# Patient Record
Sex: Male | Born: 1955 | Race: White | Hispanic: No | Marital: Married
Health system: Midwestern US, Community
[De-identification: ages and names within clinical notes are randomized; demographics above are authoritative.]

## PROBLEM LIST (undated history)

## (undated) DIAGNOSIS — M25572 Pain in left ankle and joints of left foot: Secondary | ICD-10-CM

## (undated) DIAGNOSIS — S96912A Strain of unspecified muscle and tendon at ankle and foot level, left foot, initial encounter: Secondary | ICD-10-CM

## (undated) DIAGNOSIS — Z01818 Encounter for other preprocedural examination: Secondary | ICD-10-CM

## (undated) DIAGNOSIS — M7672 Peroneal tendinitis, left leg: Secondary | ICD-10-CM

## (undated) DIAGNOSIS — X503XXA Overexertion from repetitive movements, initial encounter: Secondary | ICD-10-CM

---

## 2015-04-13 ENCOUNTER — Encounter

## 2015-04-22 ENCOUNTER — Inpatient Hospital Stay
Admit: 2015-04-22 | Payer: TRICARE (CHAMPUS) | Attending: Foot & Ankle Surgery | Primary: Student in an Organized Health Care Education/Training Program

## 2015-04-22 DIAGNOSIS — M25572 Pain in left ankle and joints of left foot: Secondary | ICD-10-CM

## 2015-05-13 ENCOUNTER — Encounter

## 2015-05-13 ENCOUNTER — Inpatient Hospital Stay: Admit: 2015-05-13 | Payer: TRICARE (CHAMPUS) | Primary: Student in an Organized Health Care Education/Training Program

## 2015-05-13 DIAGNOSIS — Z01818 Encounter for other preprocedural examination: Secondary | ICD-10-CM

## 2015-05-13 LAB — CBC WITH AUTOMATED DIFF
ABS. BASOPHILS: 0.1 10*3/uL — ABNORMAL HIGH (ref 0.0–0.06)
ABS. EOSINOPHILS: 0.2 10*3/uL (ref 0.0–0.4)
ABS. LYMPHOCYTES: 2.1 10*3/uL (ref 0.9–3.6)
ABS. MONOCYTES: 0.7 10*3/uL (ref 0.05–1.2)
ABS. NEUTROPHILS: 4.9 10*3/uL (ref 1.8–8.0)
BASOPHILS: 1 % (ref 0–2)
EOSINOPHILS: 2 % (ref 0–5)
HCT: 37 % (ref 36.0–48.0)
HGB: 12.9 g/dL — ABNORMAL LOW (ref 13.0–16.0)
LYMPHOCYTES: 27 % (ref 21–52)
MCH: 32.8 PG (ref 24.0–34.0)
MCHC: 34.9 g/dL (ref 31.0–37.0)
MCV: 94.1 FL (ref 74.0–97.0)
MONOCYTES: 9 % (ref 3–10)
MPV: 8.6 FL — ABNORMAL LOW (ref 9.2–11.8)
NEUTROPHILS: 61 % (ref 40–73)
PLATELET: 335 10*3/uL (ref 135–420)
RBC: 3.93 M/uL — ABNORMAL LOW (ref 4.70–5.50)
RDW: 12.3 % (ref 11.6–14.5)
WBC: 8 10*3/uL (ref 4.6–13.2)

## 2015-05-13 LAB — METABOLIC PANEL, BASIC
Anion gap: 8 mmol/L (ref 3.0–18)
BUN/Creatinine ratio: 10 — ABNORMAL LOW (ref 12–20)
BUN: 12 MG/DL (ref 7.0–18)
CO2: 31 mmol/L (ref 21–32)
Calcium: 9.1 MG/DL (ref 8.5–10.1)
Chloride: 100 mmol/L (ref 100–108)
Creatinine: 1.19 MG/DL (ref 0.6–1.3)
GFR est AA: >60 mL/min/{1.73_m2} (ref >60)
GFR est non-AA: >60 mL/min/{1.73_m2} (ref >60)
Glucose: 94 mg/dL (ref 74–99)
Potassium: 4.3 mmol/L (ref 3.5–5.5)
Sodium: 139 mmol/L (ref 136–145)

## 2015-05-13 LAB — PROTHROMBIN TIME + INR
INR: 1 (ref 0.8–1.2)
Prothrombin time: 12.6 s (ref 11.5–15.2)

## 2015-05-13 LAB — PTT: aPTT: 30.7 s (ref 23.0–36.4)

## 2015-05-14 LAB — URINALYSIS W/ RFLX MICROSCOPIC
Bilirubin: NEGATIVE
Blood: NEGATIVE
Glucose: NEGATIVE mg/dL
Ketone: NEGATIVE mg/dL
Leukocyte Esterase: NEGATIVE
Nitrites: NEGATIVE
Protein: NEGATIVE mg/dL
Specific gravity: 1.006 (ref 1.005–1.030)
Urobilinogen: 0.2 EU/dL (ref 0.2–1.0)
pH (UA): 6.5 (ref 5.0–8.0)

## 2015-05-14 LAB — EKG, 12 LEAD, INITIAL
Atrial Rate: 61 {beats}/min
Calculated P Axis: 46 degrees
Calculated R Axis: -16 degrees
Calculated T Axis: 23 degrees
Diagnosis: NORMAL
P-R Interval: 184 ms
Q-T Interval: 404 ms
QRS Duration: 98 ms
QTC Calculation (Bezet): 406 ms
Ventricular Rate: 61 {beats}/min

## 2015-05-14 LAB — MIH FAX RESULT: FAX TO NUMBER: 9685445

## 2015-05-14 NOTE — Other (Signed)
Denies any prostheticsPatient states family physician is aware of upcoming procedure/surgeryDenies sleep apneaDenies family history of anesthesia complicationsDenies shortness of breath nor chest pain while climbing stairs  Does not meet spec pop

## 2015-05-15 ENCOUNTER — Inpatient Hospital Stay: Payer: TRICARE (CHAMPUS) | Primary: Student in an Organized Health Care Education/Training Program

## 2015-05-21 ENCOUNTER — Ambulatory Visit: Admit: 2015-05-21 | Payer: TRICARE (CHAMPUS) | Primary: Student in an Organized Health Care Education/Training Program

## 2015-05-21 ENCOUNTER — Inpatient Hospital Stay: Payer: TRICARE (CHAMPUS)

## 2015-05-21 MED ORDER — PROPOFOL 10 MG/ML IV EMUL
10 mg/mL | INTRAVENOUS | Status: DC | PRN
Start: 2015-05-21 — End: 2015-05-21
  Administered 2015-05-21: 12:00:00 via INTRAVENOUS

## 2015-05-21 MED ORDER — SODIUM CHLORIDE 0.9 % INJECTION
INTRAMUSCULAR | Status: AC
Start: 2015-05-21 — End: ?

## 2015-05-21 MED ORDER — GLYCOPYRROLATE 0.2 MG/ML IJ SOLN
0.2 mg/mL | INTRAMUSCULAR | Status: DC | PRN
Start: 2015-05-21 — End: 2015-05-21
  Administered 2015-05-21: 12:00:00 via INTRAVENOUS

## 2015-05-21 MED ORDER — HYDROMORPHONE 2 MG/ML INJECTION SOLUTION
2 mg/mL | INTRAMUSCULAR | Status: DC | PRN
Start: 2015-05-21 — End: 2015-05-21

## 2015-05-21 MED ORDER — SUCCINYLCHOLINE CHLORIDE 20 MG/ML INJECTION
20 mg/mL | INTRAMUSCULAR | Status: AC
Start: 2015-05-21 — End: ?

## 2015-05-21 MED ORDER — BUPIVACAINE-EPINEPHRINE (PF) 0.5 %-1:200,000 IJ SOLN
0.5 %-1:200,000 | INTRAMUSCULAR | Status: AC
Start: 2015-05-21 — End: ?

## 2015-05-21 MED ORDER — ROCURONIUM 10 MG/ML IV
10 mg/mL | INTRAVENOUS | Status: AC
Start: 2015-05-21 — End: ?

## 2015-05-21 MED ORDER — ONDANSETRON (PF) 4 MG/2 ML INJECTION
4 mg/2 mL | INTRAMUSCULAR | Status: AC
Start: 2015-05-21 — End: ?

## 2015-05-21 MED ORDER — BACITRACIN 50,000 UNIT IM
50000 unit | INTRAMUSCULAR | Status: AC
Start: 2015-05-21 — End: ?

## 2015-05-21 MED ORDER — LACTATED RINGERS IV
INTRAVENOUS | Status: DC
Start: 2015-05-21 — End: 2015-05-21
  Administered 2015-05-21 (×3): via INTRAVENOUS

## 2015-05-21 MED ORDER — ONDANSETRON (PF) 4 MG/2 ML INJECTION
4 mg/2 mL | INTRAMUSCULAR | Status: DC | PRN
Start: 2015-05-21 — End: 2015-05-21
  Administered 2015-05-21: 12:00:00 via INTRAVENOUS

## 2015-05-21 MED ORDER — BUPIVACAINE-EPINEPHRINE (PF) 0.5 %-1:200,000 IJ SOLN
0.5 %-1:200,000 | INTRAMUSCULAR | Status: DC | PRN
Start: 2015-05-21 — End: 2015-05-21
  Administered 2015-05-21: 12:00:00 via INTRAMUSCULAR

## 2015-05-21 MED ORDER — FAMOTIDINE 20 MG IN 50 ML NS IVPB
20 mg/50 mL | Freq: Once | INTRAVENOUS | Status: DC
Start: 2015-05-21 — End: 2015-05-21

## 2015-05-21 MED ORDER — FENTANYL CITRATE (PF) 50 MCG/ML IJ SOLN
50 mcg/mL | INTRAMUSCULAR | Status: DC | PRN
Start: 2015-05-21 — End: 2015-05-21
  Administered 2015-05-21 (×6): via INTRAVENOUS

## 2015-05-21 MED ORDER — MIDAZOLAM 1 MG/ML IJ SOLN
1 mg/mL | INTRAMUSCULAR | Status: DC | PRN
Start: 2015-05-21 — End: 2015-05-21
  Administered 2015-05-21: 12:00:00 via INTRAVENOUS

## 2015-05-21 MED ORDER — LIDOCAINE (PF) 20 MG/ML (2 %) IJ SOLN
20 mg/mL (2 %) | INTRAMUSCULAR | Status: AC
Start: 2015-05-21 — End: ?

## 2015-05-21 MED ORDER — EPHEDRINE (PF) 50 MG/10 ML (5 MG/ML) IN NS IV SYRINGE
50 mg/10 mL (5 mg/mL) | INTRAVENOUS | Status: AC
Start: 2015-05-21 — End: ?

## 2015-05-21 MED ORDER — MIDAZOLAM 1 MG/ML IJ SOLN
1 mg/mL | INTRAMUSCULAR | Status: AC
Start: 2015-05-21 — End: ?

## 2015-05-21 MED ORDER — GLYCOPYRROLATE 0.2 MG/ML IJ SOLN
0.2 mg/mL | INTRAMUSCULAR | Status: AC
Start: 2015-05-21 — End: ?

## 2015-05-21 MED ORDER — SODIUM CHLORIDE 0.9 % INJECTION
20 mg/2 mL | Freq: Once | INTRAMUSCULAR | Status: AC
Start: 2015-05-21 — End: 2015-05-21
  Administered 2015-05-21: 11:00:00 via INTRAVENOUS

## 2015-05-21 MED ORDER — PROPOFOL 10 MG/ML IV EMUL
10 mg/mL | INTRAVENOUS | Status: AC
Start: 2015-05-21 — End: ?

## 2015-05-21 MED ORDER — ACETAMINOPHEN 1,000 MG/100 ML (10 MG/ML) IV
1000 mg/100 mL (10 mg/mL) | Freq: Once | INTRAVENOUS | Status: DC | PRN
Start: 2015-05-21 — End: 2015-05-21

## 2015-05-21 MED ORDER — EPHEDRINE (PF) 50 MG/10 ML (5 MG/ML) IN NS IV SYRINGE
50 mg/10 mL (5 mg/mL) | INTRAVENOUS | Status: DC | PRN
Start: 2015-05-21 — End: 2015-05-21
  Administered 2015-05-21 (×4): via INTRAVENOUS

## 2015-05-21 MED ORDER — CEFAZOLIN 2 G IN 100 ML 0.9% NS
2 gram/100 mL | Freq: Once | INTRAVENOUS | Status: AC
Start: 2015-05-21 — End: 2015-05-21
  Administered 2015-05-21: 12:00:00 via INTRAVENOUS

## 2015-05-21 MED ORDER — SODIUM CHLORIDE 0.9 % IRRIGATION SOLN
0.9 % | Status: DC | PRN
Start: 2015-05-21 — End: 2015-05-21
  Administered 2015-05-21: 12:00:00

## 2015-05-21 MED ORDER — FENTANYL CITRATE (PF) 50 MCG/ML IJ SOLN
50 mcg/mL | INTRAMUSCULAR | Status: AC
Start: 2015-05-21 — End: ?

## 2015-05-21 MED ORDER — KETOROLAC TROMETHAMINE 30 MG/ML INJECTION
30 mg/mL (1 mL) | Freq: Once | INTRAMUSCULAR | Status: DC | PRN
Start: 2015-05-21 — End: 2015-05-21

## 2015-05-21 MED ORDER — LIDOCAINE (PF) 20 MG/ML (2 %) IJ SOLN
20 mg/mL (2 %) | INTRAMUSCULAR | Status: DC | PRN
Start: 2015-05-21 — End: 2015-05-21
  Administered 2015-05-21: 12:00:00 via INTRAVENOUS

## 2015-05-21 MED ORDER — ROCURONIUM 10 MG/ML IV
10 mg/mL | INTRAVENOUS | Status: DC | PRN
Start: 2015-05-21 — End: 2015-05-21
  Administered 2015-05-21: 12:00:00 via INTRAVENOUS

## 2015-05-21 MED FILL — QUELICIN 20 MG/ML INJECTION SOLUTION: 20 mg/mL | INTRAMUSCULAR | Qty: 10

## 2015-05-21 MED FILL — PROPOFOL 10 MG/ML IV EMUL: 10 mg/mL | INTRAVENOUS | Qty: 20

## 2015-05-21 MED FILL — SODIUM CHLORIDE 0.9 % INJECTION: INTRAMUSCULAR | Qty: 10

## 2015-05-21 MED FILL — LACTATED RINGERS IV: INTRAVENOUS | Qty: 1000

## 2015-05-21 MED FILL — FAMOTIDINE (PF) 20 MG/2 ML IV: 20 mg/2 mL | INTRAVENOUS | Qty: 2

## 2015-05-21 MED FILL — BACITRACIN 50,000 UNIT IM: 50000 unit | INTRAMUSCULAR | Qty: 50000

## 2015-05-21 MED FILL — CEFAZOLIN 2 G IN 100 ML 0.9% NS: 2 gram/100 mL | INTRAVENOUS | Qty: 100

## 2015-05-21 MED FILL — ROCURONIUM 10 MG/ML IV: 10 mg/mL | INTRAVENOUS | Qty: 5

## 2015-05-21 MED FILL — EPHEDRINE (PF) 50 MG/10 ML (5 MG/ML) IN NS IV SYRINGE: 50 mg/10 mL (5 mg/mL) | INTRAVENOUS | Qty: 10

## 2015-05-21 MED FILL — MARCAINE-EPINEPHRINE (PF) 0.5 %-1:200,000 INJECTION SOLUTION: 0.5 %-1:200,000 | INTRAMUSCULAR | Qty: 30

## 2015-05-21 MED FILL — ONDANSETRON (PF) 4 MG/2 ML INJECTION: 4 mg/2 mL | INTRAMUSCULAR | Qty: 2

## 2015-05-21 MED FILL — MIDAZOLAM 1 MG/ML IJ SOLN: 1 mg/mL | INTRAMUSCULAR | Qty: 2

## 2015-05-21 MED FILL — LIDOCAINE (PF) 20 MG/ML (2 %) IJ SOLN: 20 mg/mL (2 %) | INTRAMUSCULAR | Qty: 10

## 2015-05-21 MED FILL — GLYCOPYRROLATE 0.2 MG/ML IJ SOLN: 0.2 mg/mL | INTRAMUSCULAR | Qty: 2

## 2015-05-21 MED FILL — FENTANYL CITRATE (PF) 50 MCG/ML IJ SOLN: 50 mcg/mL | INTRAMUSCULAR | Qty: 5

## 2015-05-21 NOTE — Op Note (Signed)
[facility]  OPERATIVE REPORT    Name:  Lawrence Lawrence  MR#:  [id number]  DOB:  [birth year]  Account #:  [id number]  Date of Adm:  05/21/2015  Date of Surgery:  05/21/2015      SPECIMENS REMOVED: none    SURGEON: Doretha ImusAaron Jahree Dermody, DPM    ASSISTANT: Eliberto IvoryAustin.    PREOPERATIVE DIAGNOSES:  1. Painful spontaneous rupture of the left peroneus brevis tendon.  2. Painful left peroneal tendinitis.    POSTOPERATIVE DIAGNOSES:  1. Painful spontaneous rupture of the left peroneus brevis tendon.  2. Painful left peroneal tendinitis.    PROCEDURES:  1. Repair of dislocating peroneal tendons with fibular osteotomy, left  ankle.  2. Repair of left ruptured peroneus brevis tendon with graft.    ANESTHESIA: General with 10 mL of 0.5% Marcaine with epinephrine  to the left ankle.    HEMOSTASIS: None. There was no tourniquet used in this procedure.    ESTIMATED BLOOD LOSS: Minimal, less than 1 mL.    MATERIALS: [employer] GraftJacket MaxForce 4 x 7 graft and  also a [facility] GraftJacket low profile 2 x 4 cm graft, 3-0 Vicryl,  5-0 nylon, 3-0 and 4-0 Prolene were also used.    INJECTABLES: 16 mL of 0.5% Marcaine with epinephrine.    COMPLICATIONS: None.    INDICATIONS FOR PROCEDURE: The patient is a 60 year old male  [name]  correction of painful left ankle. All conservative treatments have failed  to offer the patient adequate relief and the patient desires surgical  correction. The planned procedures were explained to the patient in  detail including all risks, benefits, and possible complications, and the  patient still desires surgical correction. Medical clearance was obtained  prior to surgery. Consent was signed and on chart. All the patient's  questions were answered and no guarantees were given. Also, the  patient was asked if he had any history of blood clots or bleeding  disorders, and he stated that he had none.    DESCRIPTION OF  PROCEDURE: The patient was brought to the  operating room and placed on the operating room table in the prone  position. The patient's left ankle was then anesthetized using 10 mL of  0.5% Marcaine with epinephrine as an ankle block. Next, the patient's  left ankle and foot were then prepped and draped in the usual sterile  manner. Again, no tourniquet was used for this procedure.    Attention was then drawn to the lateral aspect of the patient's left ankle  where a #15 blade was then used to make an approximately 10 cm  curvilinear incision starting at the posterior aspect of the distal fibula  and lateral malleolus and then coursing down along the peroneal  tendons. A second #15 blade was then used to deepen this incision  through the deeper layers, being careful to avoid any neurovascular  structures. Once dissection was then carried down through the deeper  tissues to expose the lateral malleolus along with the peroneal tendon  sheath, the peroneal tendon sheath was then gently incised and the  peroneal tendons were then identified. The peroneus longus tendon  was superficial, which was then retracted out of the way and inspected.  The peroneus longus tendon was found to be free of defects.  Next, the peroneus brevis tendon, which was then deeper to the  longus tendon was then inspected. The peroneus brevis tendon was  then  found to have a large, approximately 3 cm longitudinal tear at the  posterior articulation with the fibula. This had caused the tendon to  actually split and open up and then form a flat deformity in the tendon.  This torn tendon was apparently causing the patient pain as it  would pop back and forth at the fibular articulation. Therefore, the  peroneus brevis tendon tear was repaired using 3-0 Vicryl in a running  stitch. After this was done, the tendon was then supported and  repaired using the GraftJacket low profile graft and sutured into place  using 4-0 Prolene. The graft was then  tubularized and reinforced so  that it would not move along the tendon as the tendon was put through  it's use, and articulated with the posterior aspect of the fibula. Next, the  distal posterior fibula was inspected and found to be relatively flat with  no real fibular groove for the peroneal tendons to sit in, and therefore  causing the peroneal tendons to sublux and pop out of place when the  patient would walk. Therefore, the bone bur was then used to create a  deep peroneal groove in the fibula for the tendons to articulate.  This  would hold them in place, doing ankle dorsiflexion, plantar flexion  range of motion. After the fibular osteotomy was completed, the area  was flushed with copious amounts of normal sterile saline. Next, the  peroneal tendons were put back into their original positions and the  tendon sheaths were then repaired using 3-0 Vicryl in a running  stitch. Next, a MaxForce GraftJacket was then used to reinforce the  soft tissue retinaculum that holds the tendons in place and prevents  them from subluxating over the lateral fibular groove. This graft was  sutured into place using 3-0 Prolene. This graft was the MaxForce 5 x  7 cm graft. This graft was sutured into place tightly to again reinforce  and hold the peroneal tendons down in place. At this point, the  patient's left ankle was then put through normal range of motion, both  dorsiflexion, plantar flexion, and eversion/inversion and found to be  adequate with no problems. The peroneal tendons were found to glide  smoothly into the new fibular groove and found to be free of  subluxation or dislocation. Therefore, the deep tissues were closed  using 3-0 Vicryl in a simple interrupted technique. The surgical site was  then flushed again with copious amounts of normal sterile saline. The  skin was then closed using 5-0 nylon in a subcuticular running stitch.  The incision was then supported using Steri-Strips, Adaptic, 4 x 4s,  Kerlix,  ABDs and an Ace wrap. The patient was then transported to the  recovery room with vital signs stable and neurovascular status  returned to baseline for the patient's left foot. The patient tolerated the  procedures and anesthesia well with no complications.    POSTOPERATIVE CHECK: The patient was later seen in the recovery  room with his dressings clean, dry, and intact with no bleeding noted to  his left foot or ankle. The patient stated that he had no pain. The  patient's vascular status was then checked and capillary refill for all 5  toes on his left foot and found to be less than 2 seconds for each toe.  The patient was later discharged to home. He was given a  postoperative prescription for pain, also a surgical shoe. He was given  also discharge  instructions. He was also given a followup appointment  with Dr. Emily Filbert in the office in 3 days. Lastly, he and his wife were  given Dr. Dellia Nims cell phone number and instructed to call anytime with  any problems.        Clemetine Marker, DPM    AG / MS1  D:  05/21/2015   14:40  T:  05/21/2015   18:32  Job #:  [id number]

## 2015-05-21 NOTE — Op Note (Signed)
Will dictate full up note into the system

## 2015-05-21 NOTE — Anesthesia Post-Procedure Evaluation (Signed)
Post-Anesthesia Evaluation and Assessment    Cardiovascular Function/Vital Signs  Visit Vitals   ??? BP 148/67   ??? Pulse 74   ??? Temp 36.3 ??C (97.4 ??F)   ??? Resp 12   ??? Ht 6' (1.829 m)   ??? Wt 101.8 kg (224 lb 8 oz)   ??? SpO2 97%   ??? BMI 30.45 kg/m2       Patient is status post Procedure(s):  LEFT PERONEAL TENDON REPAIR W/GRAFT,REPAIR OF DISLOCATION PERONEAL TENDON W/FIBULAR OSTEOTOMY,.    Nausea/Vomiting: Controlled.    Postoperative hydration reviewed and adequate.    Pain:  Pain Scale 1: Numeric (0 - 10) (05/21/15 1200)  Pain Intensity 1: 0 (05/21/15 1200)   Managed.    Neurological Status:   Neuro (WDL): Exceptions to WDL (05/21/15 1144)   At baseline.    Mental Status and Level of Consciousness: Arousable.    Pulmonary Status:   O2 Device: Nasal cannula (05/21/15 1200)   Adequate oxygenation and airway patent.    Complications related to anesthesia: None    Post-anesthesia assessment completed. No concerns.    Patient has met all discharge requirements.    Signed By: Galvin Proffer, CRNA    May 21, 2015

## 2015-05-21 NOTE — H&P (Signed)
Date of Surgery Update:  Mickie BailMichael C Culliver was seen and examined.  History and physical has been reviewed. The patient has been examined. There have been no significant clinical changes since the completion of the originally dated History and Physical.    Signed By: Clemetine MarkerAaron G Bhumi Godbey, DPM     May 21, 2015 7:31 AM

## 2015-05-21 NOTE — Other (Signed)
Dr Isabella StallingBaines notified of patient drinking 8 oz water at 0430 this morning. Pepcid 20 mg IV ordered. Will adjust anesthesia plan due to water intake per Dr Isabella StallingBaines.

## 2015-05-21 NOTE — Other (Signed)
Dr Emily FilbertGould at the bed side, patient to be discharge home after Xray done, no need to wait for results, patient is NWB, to receive post-op surgical shoes, to be instructed to keep leg elevated and apply ice 20 min on 20 min off on the ankle . Keep the leg dry .

## 2015-05-21 NOTE — Other (Signed)
Wife updated with progress.

## 2015-05-21 NOTE — Brief Op Note (Signed)
BRIEF OPERATIVE NOTE    Date of Procedure: 05/21/2015   Preoperative Diagnosis: LEFT ANKLE TENDON TEAR  Postoperative Diagnosis: LEFT ANKLE TENDON TEAR    Procedure(s):  LEFT PERONEAL TENDON REPAIR W/GRAFT,REPAIR OF DISLOCATION PERONEAL TENDON W/FIBULAR OSTEOTOMY,  Surgeon(s) and Role:     * Clemetine MarkerAaron G Dyson Sevey, DPM - Primary            Surgical Staff:  Circ-1: Everlena Cooperawn F Sanders, RN  Circ-2: Alfonso RamusSamantha Pope, RN  Scrub Tech-1: Johnny BridgeAustin M Oliver  Scrub Tech-Relief: Kennon Holterhristine M Lamb  Scrub RN-1: Fransico HimMargo E Cheely, RN  Event Time In   Incision Start 0809   Incision Close 1120     Anesthesia: General   Estimated Blood Loss: min<1031ml  Specimens: * No specimens in log * none   Findings: torn left peroneus brevis tendon   Complications: none  Implants:   Implant Name Type Inv. Item Serial No. Manufacturer Lot No. LRB No. Used Action   GRAFT TISS SUB SFT MTRX 4X7CM -- 28 SQ CM MAXFORCE - [id number]  GRAFT TISS SUB SFT MTRX 4X7CM -- 28 SQ CM MAXFORCE  [employer] [id number] Left 1 Implanted   GRAFT TISS LOW PROF THIN 2X4CM -- 8 SQ CM HANDJACKET - [id number]   GRAFT TISS LOW PROF THIN 2X4CM -- 8 SQ CM HANDJACKET   [employer] [employer] [id number] Left 1 Implanted

## 2015-05-21 NOTE — Other (Signed)
TRANSFER - IN REPORT:    Verbal report received from [facility] CRNA (name) on Lawrence Cabrera  being received from OR (unit) for routine progression of care      Report consisted of patient???s Situation, Background, Assessment and   Recommendations(SBAR).     Information from the following report(s) OR Summary, Procedure Summary and Intake/Output was reviewed with the receiving nurse.    Opportunity for questions and clarification was provided.      Assessment completed upon patient???s arrival to unit and care assumed.

## 2015-05-21 NOTE — Anesthesia Pre-Procedure Evaluation (Addendum)
Anesthetic History   No history of anesthetic complications     Pertinent negatives: No PONV       Review of Systems / Medical History  Patient summary reviewed, nursing notes reviewed and pertinent labs reviewed    Pulmonary              Pertinent negatives: No COPD and asthma     Neuro/Psych   Within defined limits           Cardiovascular                Pertinent negatives: No hypertension, past MI and CAD       GI/Hepatic/Renal  Within defined limits           Pertinent negatives: No GERD, liver disease and renal disease   Endo/Other  Within defined limits        Pertinent negatives: No diabetes   Other Findings            Physical Exam    Airway  Mallampati: II  TM Distance: < 4 cm  Neck ROM: normal range of motion   Mouth opening: Normal     Cardiovascular    Rhythm: regular  Rate: normal         Dental         Pulmonary  Breath sounds clear to auscultation               Abdominal         Other Findings            Anesthetic Plan    ASA: 1  Anesthesia type: general          Induction: Intravenous and RSI  Anesthetic plan and risks discussed with: Patient      GETA mod RSI

## 2015-05-21 NOTE — Op Note (Signed)
[facility]  OPERATIVE REPORT    Name:  NHIA, HEAPHY  MR#:  [id number]  DOB:  1956-02-02  Account #:  [id number]  Date of Adm:  05/21/2015  Date of Surgery:  05/21/2015      SPECIMENS REMOVED: none    SURGEON: Doretha Imus, DPM    ASSISTANT: Eliberto Ivory.    PREOPERATIVE DIAGNOSES:  1. Painful spontaneous rupture of the left peroneus brevis tendon.  2. Painful left peroneal tendinitis.    POSTOPERATIVE DIAGNOSES:  1. Painful spontaneous rupture of the left peroneus brevis tendon.  2. Painful left peroneal tendinitis.    PROCEDURES:  1. Repair of dislocating peroneal tendons with fibular osteotomy, left  ankle.  2. Repair of left ruptured peroneus brevis tendon with graft.    ANESTHESIA: General with 10 mL of 0.5% Marcaine with epinephrine  to the left ankle.    HEMOSTASIS: None. There was no tourniquet used in this procedure.    ESTIMATED BLOOD LOSS: Minimal, less than 1 mL.    MATERIALS: [employer] GraftJacket MaxForce 4 x 7 graft and  also a [facility] low profile 2 x 4 cm graft, 3-0 Vicryl,  5-0 nylon, 3-0 and 4-0 Prolene were also used.    INJECTABLES: 16 mL of 0.5% Marcaine with epinephrine.    COMPLICATIONS: None.    INDICATIONS FOR PROCEDURE: The patient is a 60 year old male  who presents to [facility] for outpatient surgical  correction of painful left ankle. All conservative treatments have failed  to offer the patient adequate relief and the patient desires surgical  correction. The planned procedures were explained to the patient in  detail including all risks, benefits, and possible complications, and the  patient still desires surgical correction. Medical clearance was obtained  prior to surgery. Consent was signed and on chart. All the patient's  questions were answered and no guarantees were given. Also, the  patient was asked if he had any history of blood clots or bleeding  disorders, and he stated that he had none.     DESCRIPTION OF PROCEDURE: The patient was brought to the  operating room and placed on the operating room table in the prone  position. The patient's left ankle was then anesthetized using 10 mL of  0.5% Marcaine with epinephrine as an ankle block. Next, the patient's  left ankle and foot were then prepped and draped in the usual sterile  manner. Again, no tourniquet was used for this procedure.    Attention was then drawn to the lateral aspect of the patient's left ankle  where a #15 blade was then used to make an approximately 10 cm  curvilinear incision starting at the posterior aspect of the distal fibula  and lateral malleolus and then coursing down along the peroneal  tendons. A second #15 blade was then used to deepen this incision  through the deeper layers, being careful to avoid any neurovascular  structures. Once dissection was then carried down through the deeper  tissues to expose the lateral malleolus along with the peroneal tendon  sheath, the peroneal tendon sheath was then gently incised and the  peroneal tendons were then identified. The peroneus longus tendon  was superficial, which was then retracted out of the way and inspected.  The peroneus longus tendon was found to be free of defects.  Next, the peroneus brevis tendon, which was then deeper to the  longus tendon was then inspected. The peroneus brevis tendon was  then  found to have a large, approximately 3 cm longitudinal tear at the  posterior articulation with the fibula. This had caused the tendon to  actually split and open up and then form a flat deformity in the tendon.  This torn tendon was apparently causing the patient pain as it  would pop back and forth at the fibular articulation. Therefore, the  peroneus brevis tendon tear was repaired using 3-0 Vicryl in a running  stitch. After this was done, the tendon was then supported and  repaired using the GraftJacket low profile graft and sutured into place   using 4-0 Prolene. The graft was then tubularized and reinforced so  that it would not move along the tendon as the tendon was put through  it's use, and articulated with the posterior aspect of the fibula. Next, the  distal posterior fibula was inspected and found to be relatively flat with  no real fibular groove for the peroneal tendons to sit in, and therefore  causing the peroneal tendons to sublux and pop out of place when the  patient would walk. Therefore, the bone bur was then used to create a  deep peroneal groove in the fibula for the tendons to articulate.  This  would hold them in place, doing ankle dorsiflexion, plantar flexion  range of motion. After the fibular osteotomy was completed, the area  was flushed with copious amounts of normal sterile saline. Next, the  peroneal tendons were put back into their original positions and the  tendon sheaths were then repaired using 3-0 Vicryl in a running  stitch. Next, a MaxForce GraftJacket was then used to reinforce the  soft tissue retinaculum that holds the tendons in place and prevents  them from subluxating over the lateral fibular groove. This graft was  sutured into place using 3-0 Prolene. This graft was the MaxForce 5 x  7 cm graft. This graft was sutured into place tightly to again reinforce  and hold the peroneal tendons down in place. At this point, the  patient's left ankle was then put through normal range of motion, both  dorsiflexion, plantar flexion, and eversion/inversion and found to be  adequate with no problems. The peroneal tendons were found to glide  smoothly into the new fibular groove and found to be free of  subluxation or dislocation. Therefore, the deep tissues were closed  using 3-0 Vicryl in a simple interrupted technique. The surgical site was  then flushed again with copious amounts of normal sterile saline. The  skin was then closed using 5-0 nylon in a subcuticular running stitch.   The incision was then supported using Steri-Strips, Adaptic, 4 x 4s,  Kerlix, ABDs and an Ace wrap. The patient was then transported to the  recovery room with vital signs stable and neurovascular status  returned to baseline for the patient's left foot. The patient tolerated the  procedures and anesthesia well with no complications.    POSTOPERATIVE CHECK: The patient was later seen in the recovery  room with his dressings clean, dry, and intact with no bleeding noted to  his left foot or ankle. The patient stated that he had no pain. The  patient's vascular status was then checked and capillary refill for all 5  toes on his left foot and found to be less than 2 seconds for each toe.  The patient was later discharged to home. He was given a  postoperative prescription for pain, also a surgical shoe. He was given  also discharge  instructions. He was also given a followup appointment  with Dr. Emily Filbert in the office in 3 days. Lastly, he and his wife were  given Dr. Dellia Nims cell phone number and instructed to call anytime with  any problems.        Clemetine Marker, DPM    AG / MS1  D:  05/21/2015   14:40  T:  05/21/2015   18:32  Job #:  [id number]

## 2015-05-22 MED FILL — EPHEDRINE (PF) 50 MG/10 ML (5 MG/ML) IN NS IV SYRINGE: 50 mg/10 mL (5 mg/mL) | INTRAVENOUS | Qty: 7

## 2015-05-22 MED FILL — GLYCOPYRROLATE 0.2 MG/ML IJ SOLN: 0.2 mg/mL | INTRAMUSCULAR | Qty: 1

## 2015-05-22 MED FILL — PROPOFOL 10 MG/ML IV EMUL: 10 mg/mL | INTRAVENOUS | Qty: 20

## 2015-05-22 MED FILL — ONDANSETRON (PF) 4 MG/2 ML INJECTION: 4 mg/2 mL | INTRAMUSCULAR | Qty: 2

## 2015-05-22 MED FILL — XYLOCAINE-MPF 20 MG/ML (2 %) INJECTION SOLUTION: 20 mg/mL (2 %) | INTRAMUSCULAR | Qty: 3

## 2015-05-22 MED FILL — ROCURONIUM 10 MG/ML IV: 10 mg/mL | INTRAVENOUS | Qty: 5

## 2019-05-29 IMAGING — US DOP ARTERIAL LWR EXT BIL
1 series · 13 of 16 positions shown · non-contrast
Comparison: None.

HISTORY: 64 years-old Male with Peripheral vascular disease, unspecified.
TECHNIQUE: Using real-time and Doppler ultrasound, both leg arterial systems were evaluated.

[Series 1: dop arterial lwr ext bil · arterial · 13 of 62 slices shown]
[im 1/62]
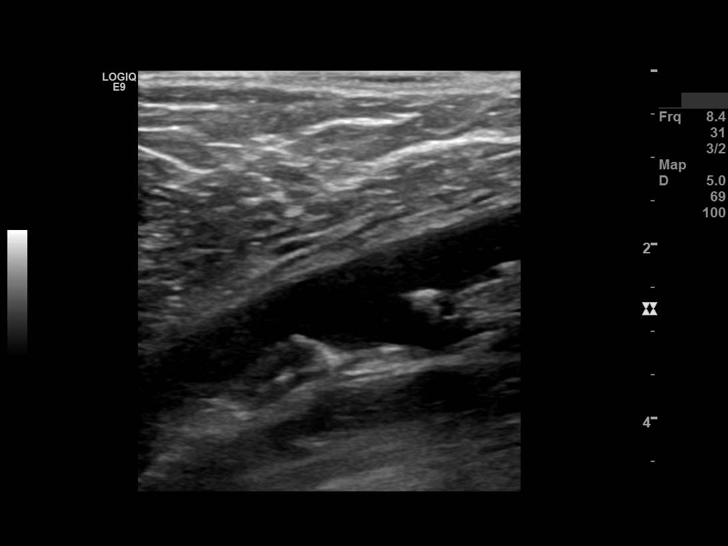
[im 5/62]
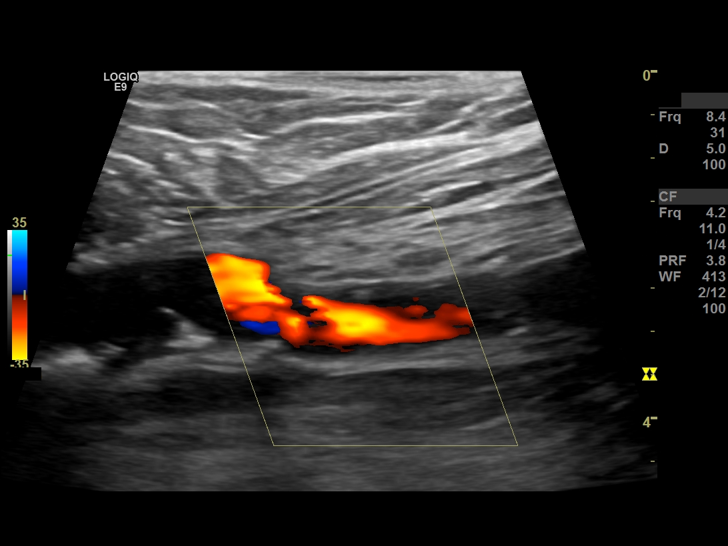
[im 13/62]
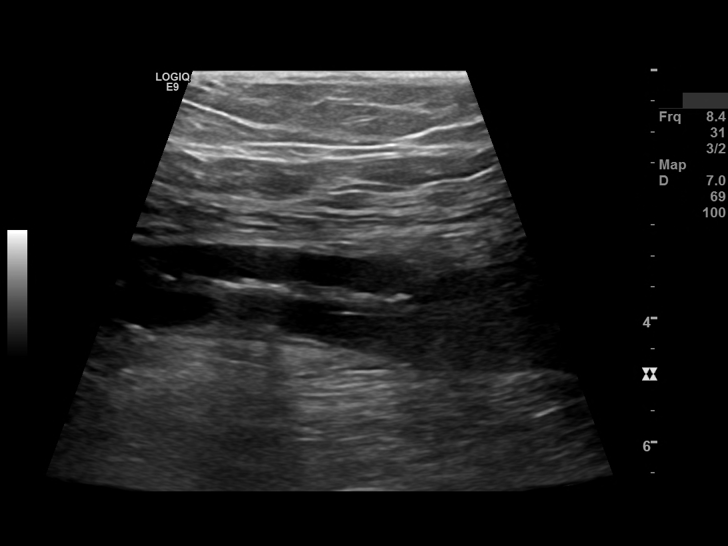
[im 17/62]
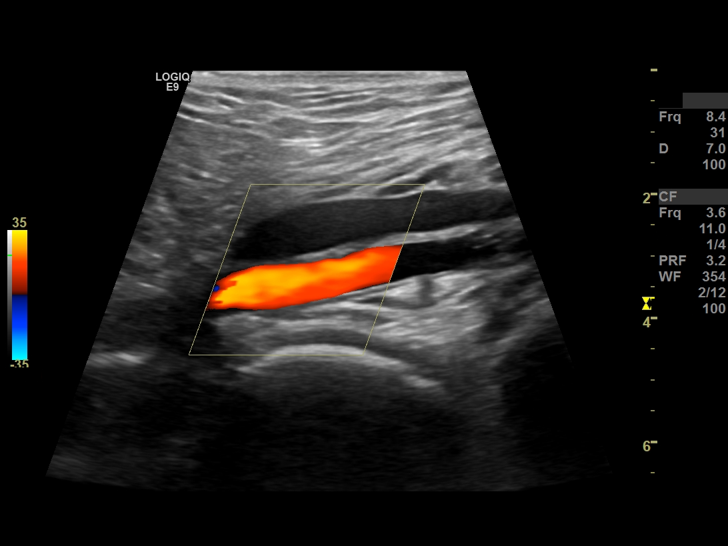
[im 21/62]
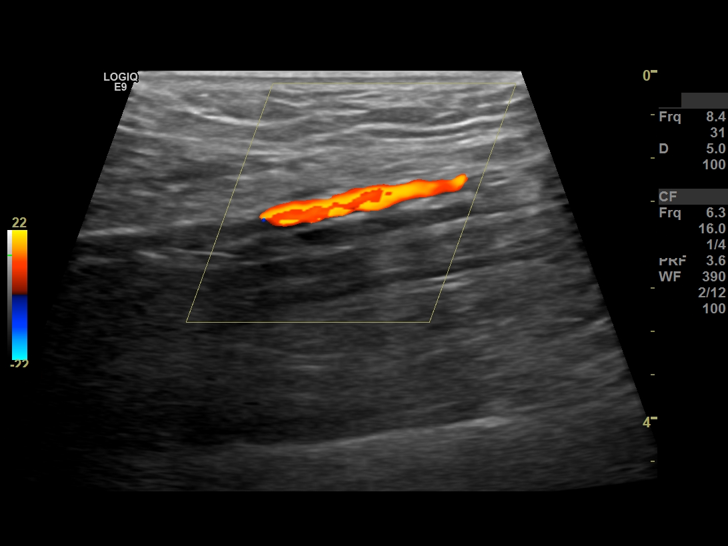
[im 25/62]
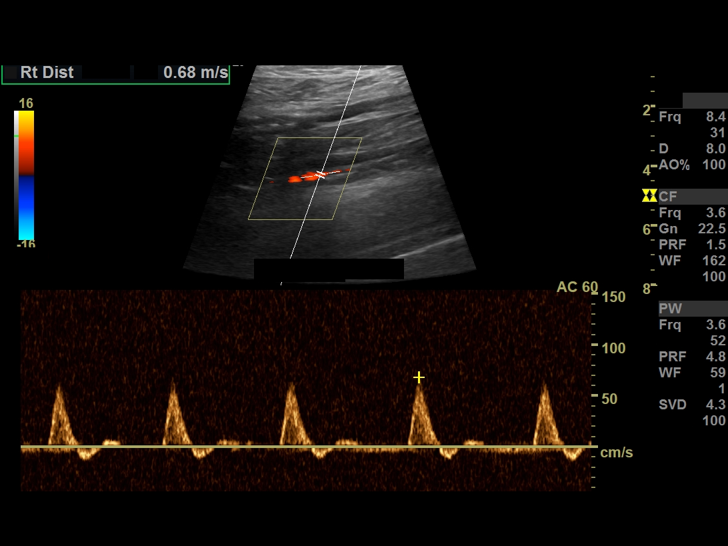
[im 33/62]
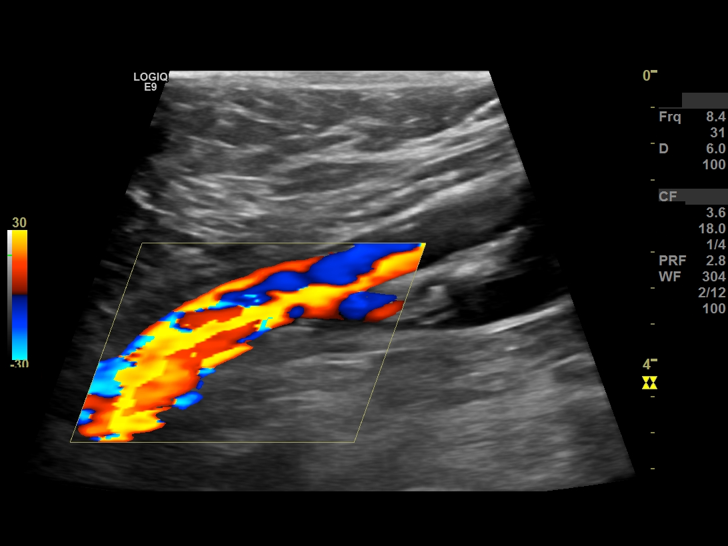
[im 37/62]
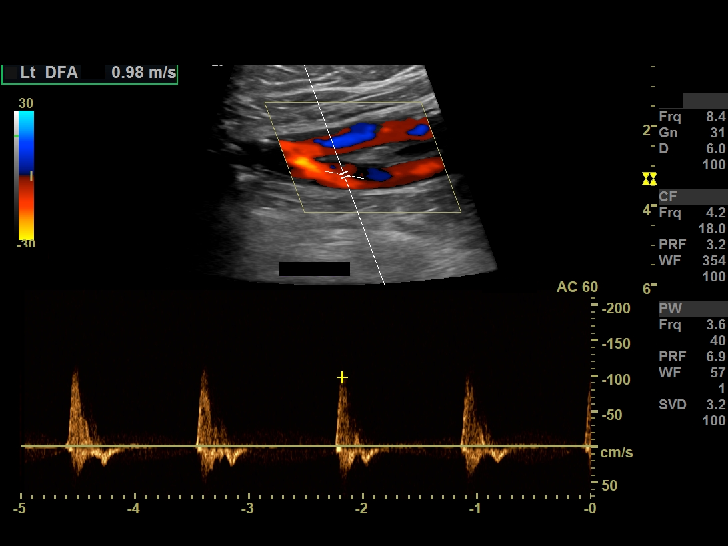
[im 41/62]
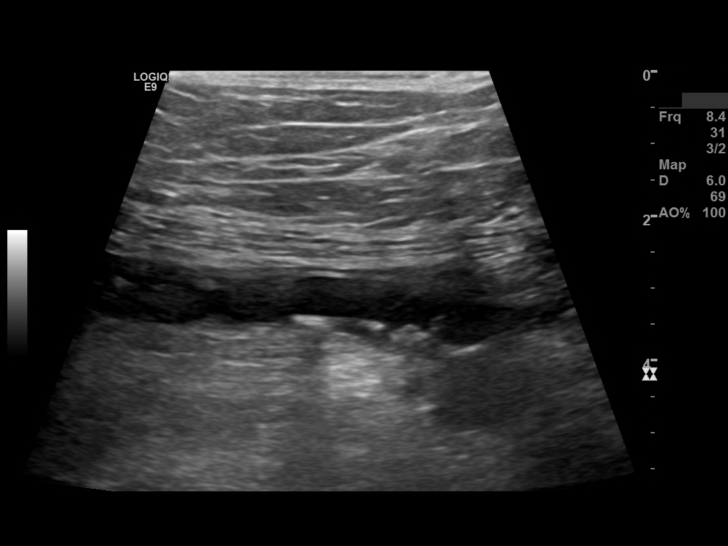
[im 45/62]
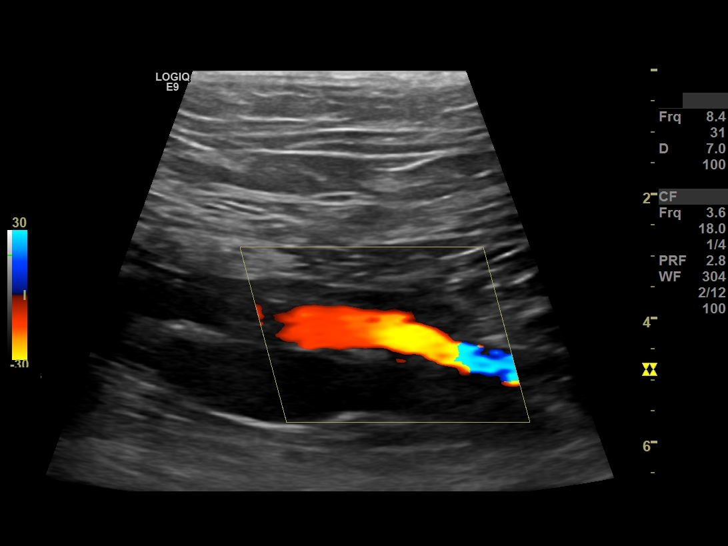
[im 49/62]
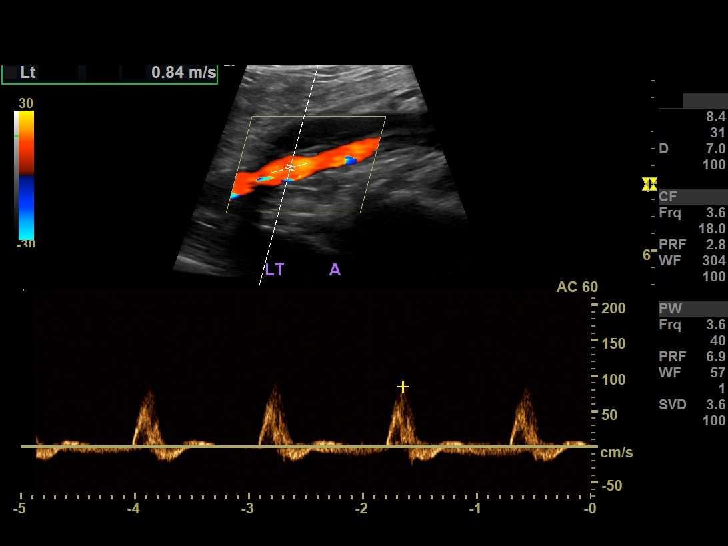
[im 57/62]
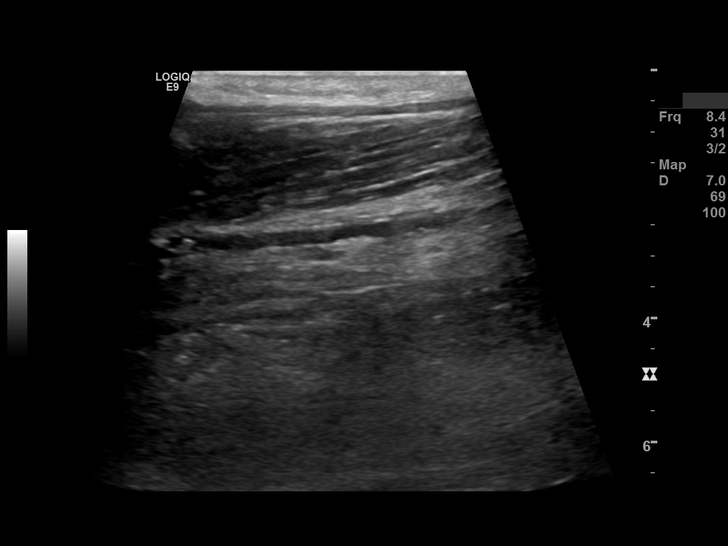
[im 62/62]
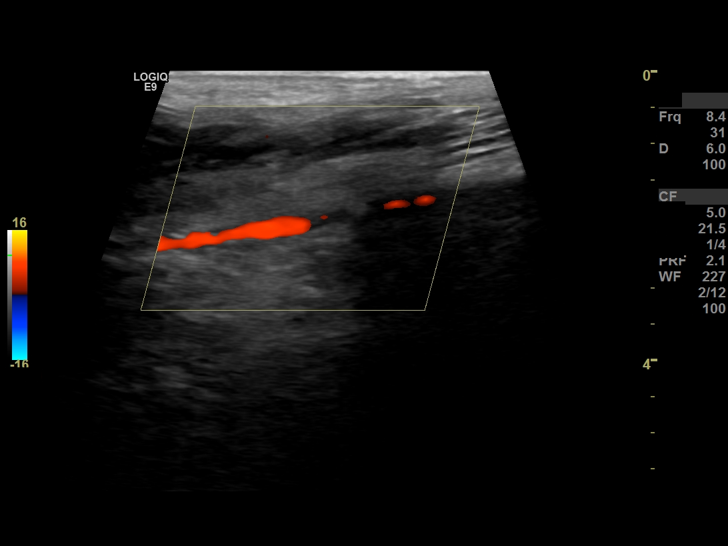

[13 of 16 positions shown; findings below may reference images not displayed]

FINDINGS: The right leg arterial system demonstrates the following waveforms and peak systolic velocities (m/s):

Common femoral artery: Triphasic,

Profunda femoral artery: Triphasic,

Superficial femoral artery proximal: Triphasic,

Superficial femoral artery mid: Triphasic,

Superficial femoral artery distal: Triphasic,

Popliteal artery proximal: Triphasic,

Popliteal artery distal: Triphasic,

Anterior tibial artery: Triphasic,

Posterior tibial artery: Triphasic,

Peroneal artery: Triphasic,

Dorsalis pedis artery: Triphasic,

The left leg arterial system demonstrates the following waveforms and peak systolic velocities (m/s):

Common femoral artery: Triphasic,

Profunda femoral artery: Triphasic,

Superficial femoral artery proximal: Triphasic,

Superficial femoral artery mid: Triphasic,

Superficial femoral artery distal: Triphasic,

Popliteal artery proximal: Triphasic,

Popliteal artery distal: Triphasic,

Anterior tibial artery: Triphasic.

Posterior tibial artery: Triphasic,

Peroneal artery: Triphasic,

Dorsalis pedis artery: Triphasic,

Cardiac rhythm: Regular.
IMPRESSION: No significant peripheral vascular disease in either leg arterial system.

## 2019-07-10 IMAGING — MR MRI KNEE LT WO CONTRAST
4 of 5 series · 29 of 40 positions shown · IV contrast (gadolinium)
Comparison: None.

HISTORY: Unilateral post-traumatic osteoarthritis, left knee.
TECHNIQUE: Multiplanar and multisequence MR imaging of the left knee was performed without the administration of intravenous gadolinium.

[Series 10: t1_cor · coronal · left · 3.5mm · 0.36mm/px · 7 of 28 slices shown]
[im 1/28]
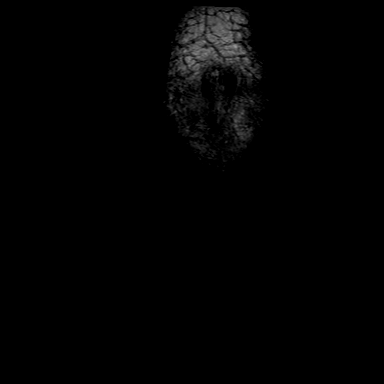
[im 5/28]
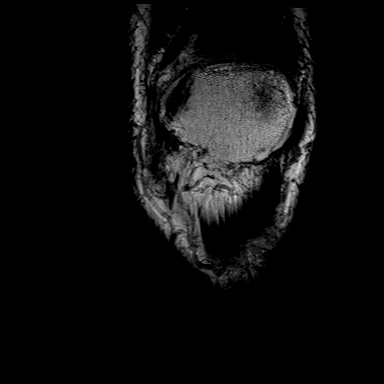
[im 10/28]
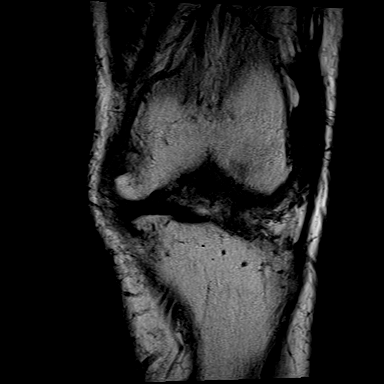
[im 14/28]
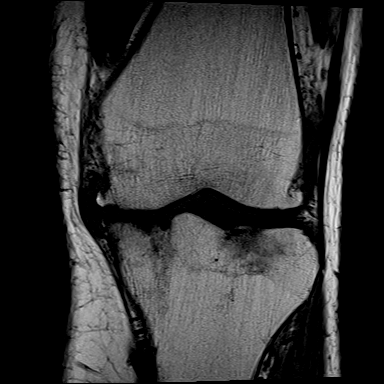
[im 19/28]
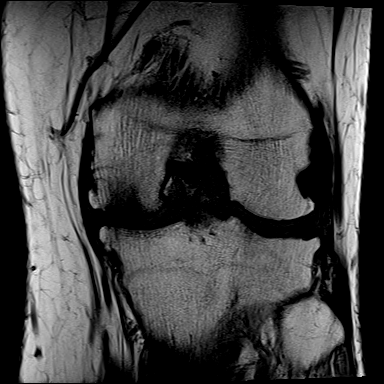
[im 23/28]
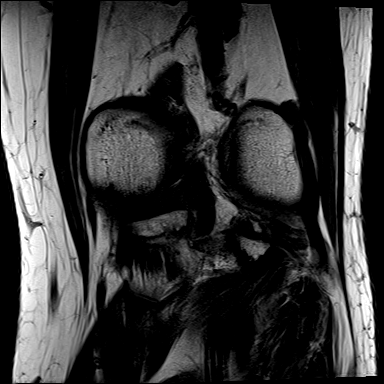
[im 28/28]
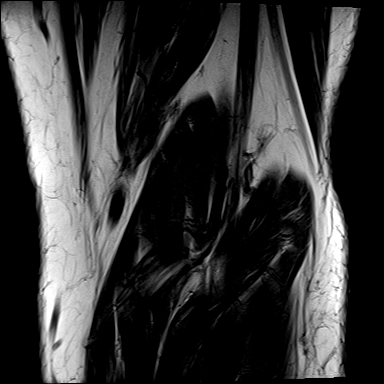

[Series 11: t2_cor_fs · coronal · left · 3.5mm · 0.44mm/px · 8 of 28 slices shown]
[im 1/28]
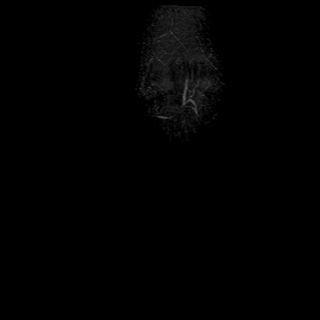
[im 4/28]
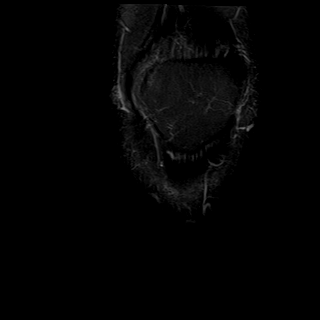
[im 8/28]
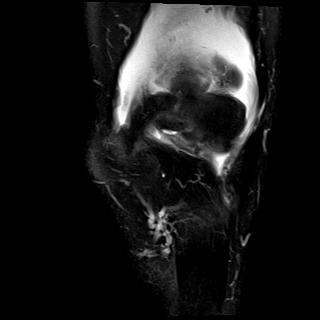
[im 12/28]
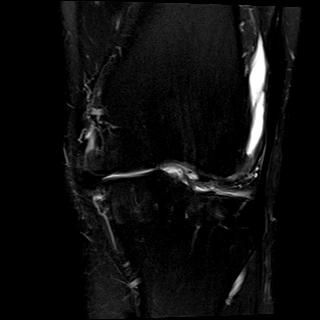
[im 16/28]
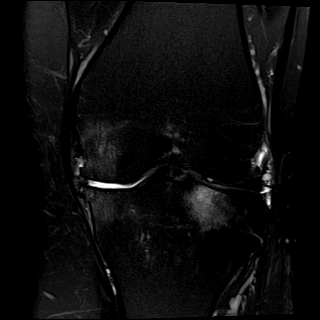
[im 20/28]
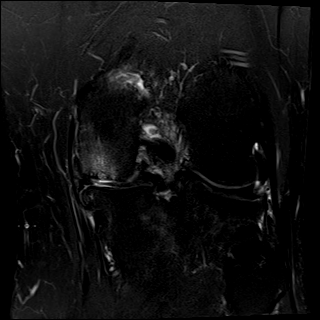
[im 24/28]
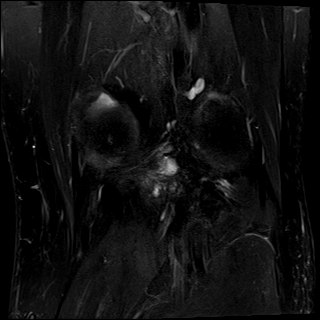
[im 28/28]
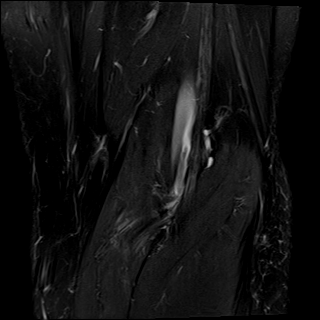

[Series 12: t2_axial_fs_r · axial · left · 3.0mm · 0.45mm/px · z∈[-43,+73]mm · 9 of 30 slices shown]
[im 1/30]
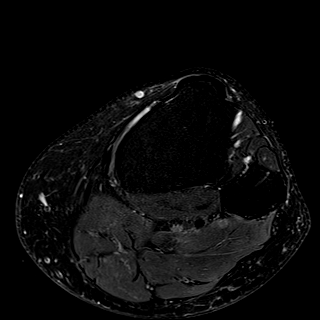
[im 4/30]
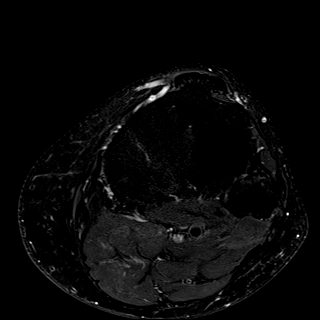
[im 8/30]
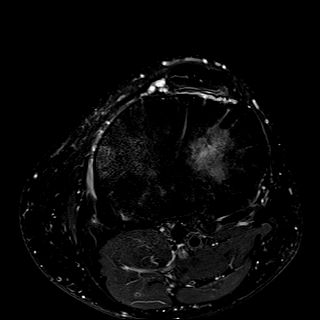
[im 11/30]
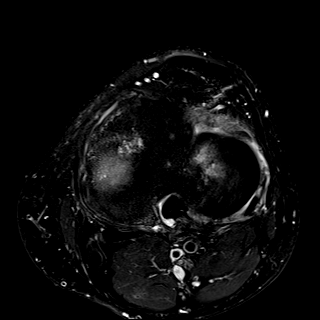
[im 15/30]
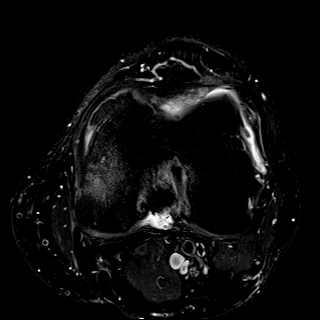
[im 19/30]
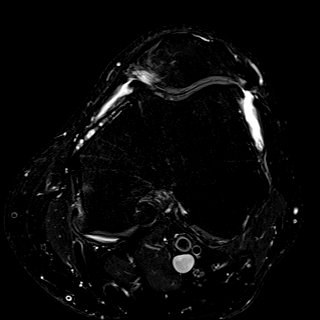
[im 22/30]
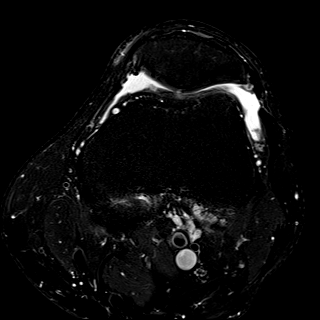
[im 26/30]
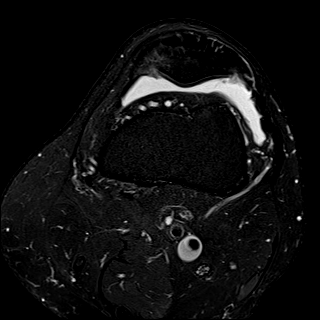
[im 30/30]
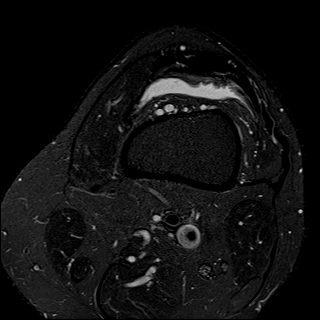

[Series 1016: pd_sag_fs_r · sagittal · left · 3.0mm · 0.23mm/px · 5 of 26 slices shown]
[im 1/26]
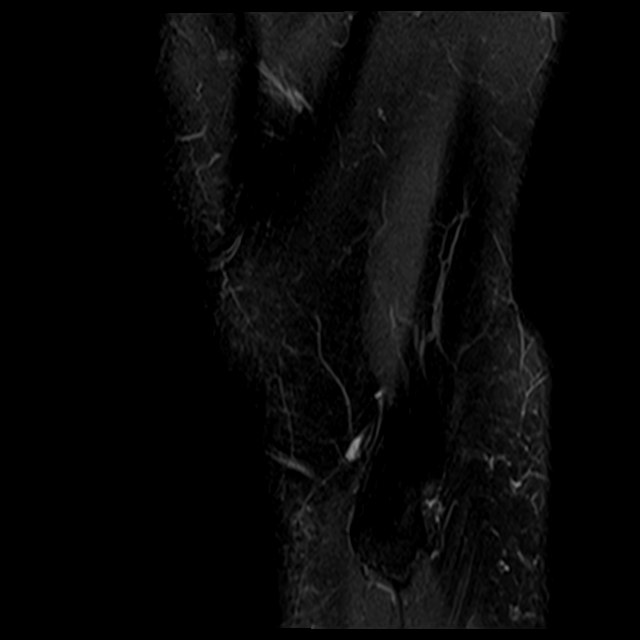
[im 4/26]
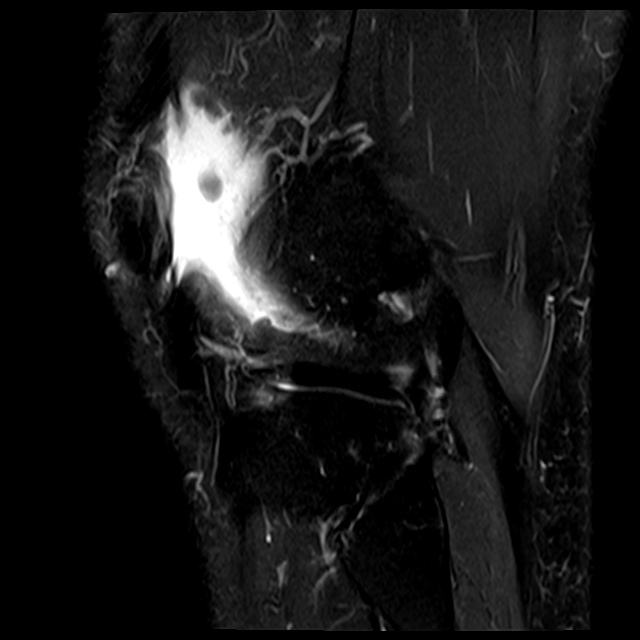
[im 8/26]
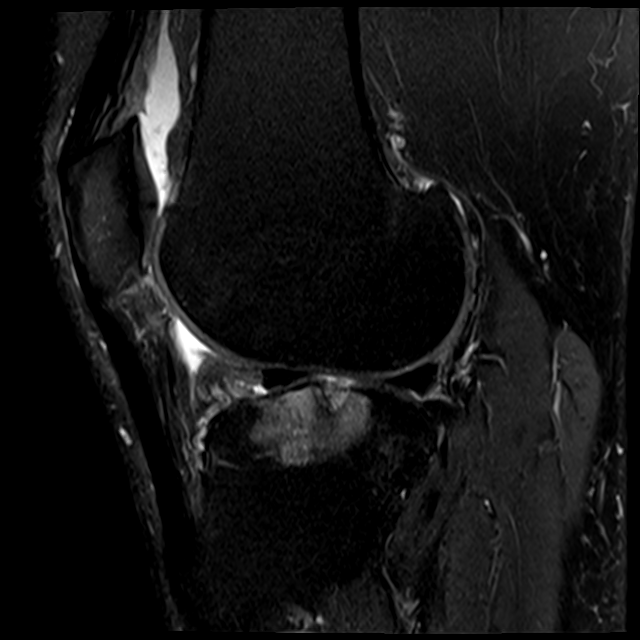
[im 15/26]
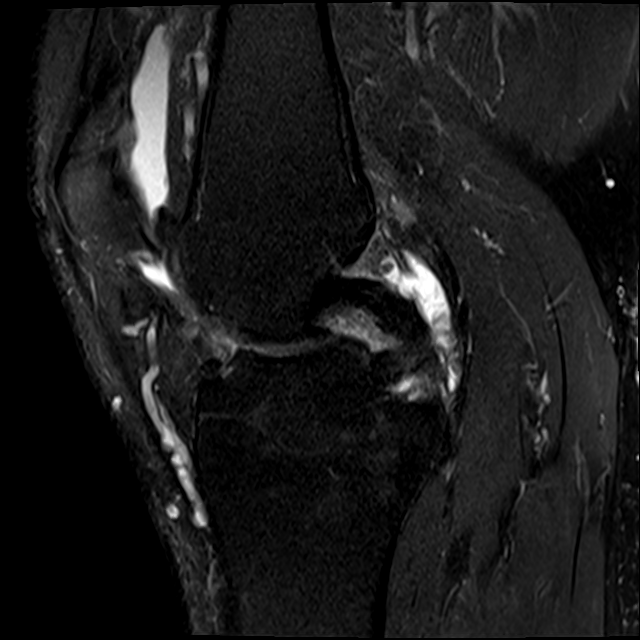
[im 22/26]
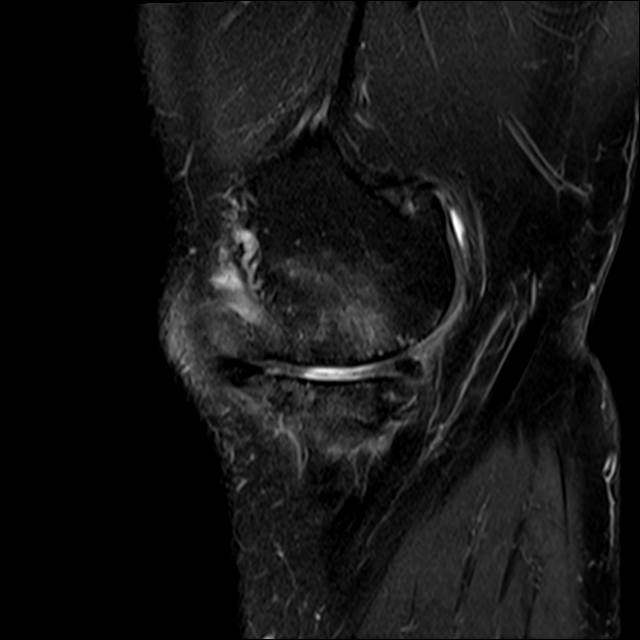

[29 of 40 positions shown; findings below may reference images not displayed]

FINDINGS: Mucoid degeneration of the ACL. ACL and PCL intact. Medial and lateral collateral ligaments are intact.

Significantly truncated appearance involving the body of the medial meniscus consistent with previous partial meniscectomy. There appears to be a recurrent horizontal tear involving the posterior horn and body. The lateral meniscus is unremarkable.

Small effusion.

Severe chondral surface irregularity medial compartment. Moderate to severe chondral surface irregularity lateral compartment. Associated reactive subcortical marrow edema. Mild to moderate chondral surface irregularity patellofemoral compartment.

Osseous structures demonstrate no fractures or destructive lesions.

Extensor mechanism is intact. Patellar retinacula are unremarkable. Signal from muscle normal. Muscle mass normal. No cystic or solid masses. No visible adenopathy.

Intra-articular fat pads are normal.
IMPRESSION: 1. Previous partial medial meniscectomy involving the body. There appears to be a recurrent horizontal tear involving the posterior horn and body.

2. Severe DJD medial compartment. Moderate to severe DJD lateral compartment. Associated reactive subcortical marrow edema and marginal osteophytosis.

3. Small effusion. 

4. Mucoid degeneration of the ACL. Cruciate and collateral ligaments intact.

## 2021-01-11 IMAGING — CT CT LOW DOSE LUNG SCREENING
2 of 4 series · 4 of 36 positions shown, 5 images · non-contrast
Comparison: Nothing of chest

HISTORY/INDICATION:  65-year-old male, former cigarette smoker 1.5 packs per day for 40 years. Stopped 9 years ago but patient currently Vapes.
TECHNIQUE: CT scan of the chest performed in the transaxial plane at 2 mm increments without IV contrast using a low-dose scanning protocol.

[Series 6: coronal · coronal · 0.59mm/px · 3 of 159 slices shown]
[im 32/159  lung]
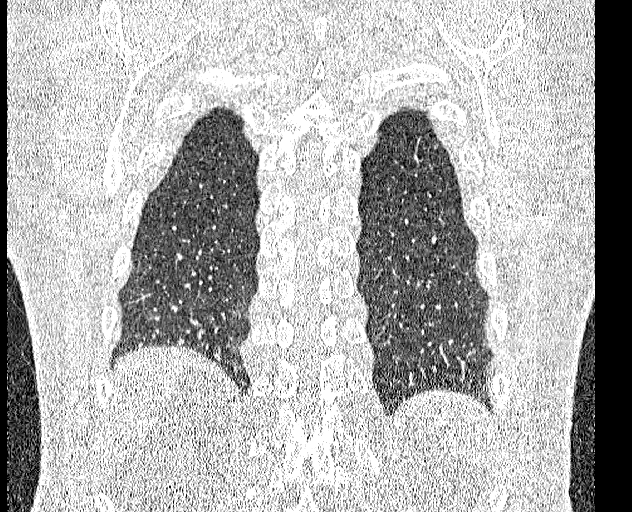
[im 64/159  lung]
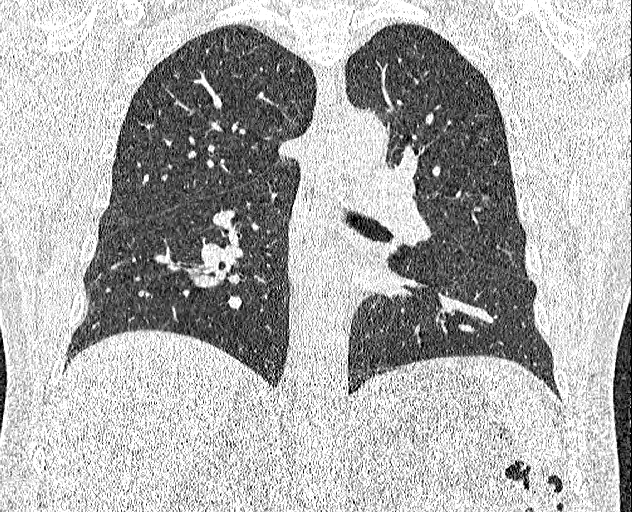
[im 95/159  lung]
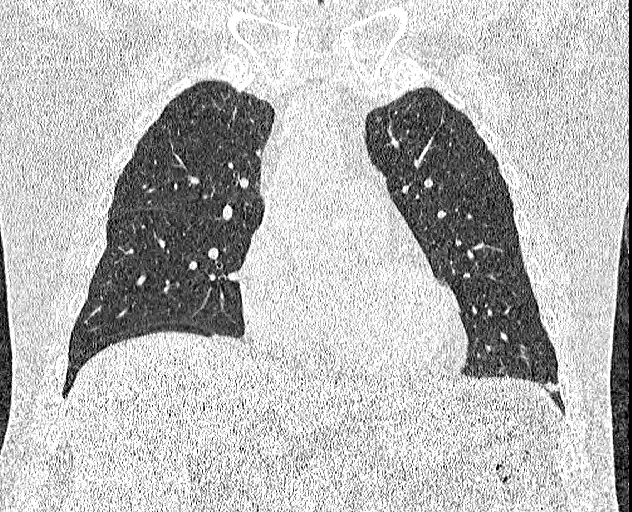

[Series 11: lung cad (10) candidates · axial · 0.76mm/px · z∈[+1541,+1541]mm · 1 of 1 slices shown, 2 images]
[im 1/1  mediastinal]
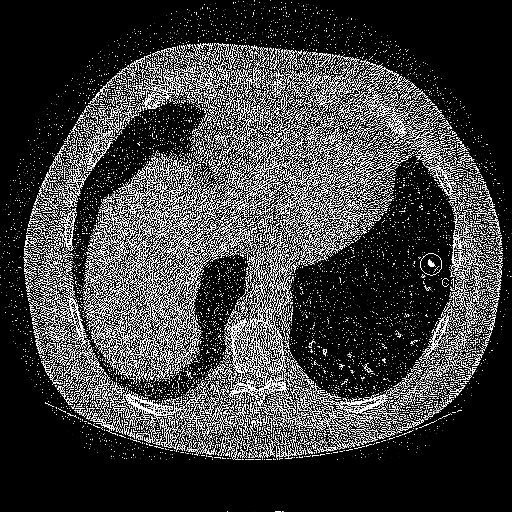
[im 1/1  lung]
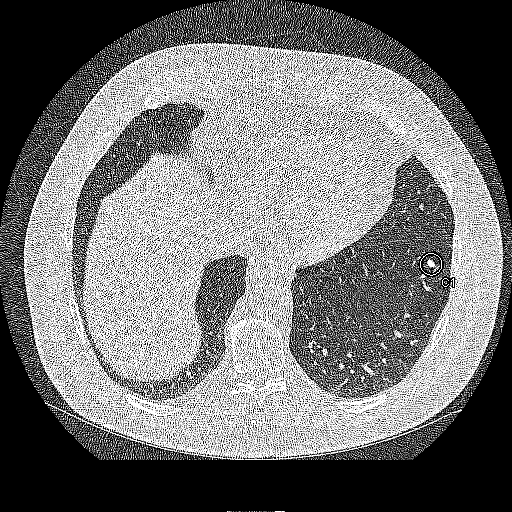

[4 of 36 positions shown; findings below may reference images not displayed]

Total radiation dose to patient is CTDIvol 0.79 mGy and DLP 27 mGy-cm.  

Dose reduction technique used: Automated exposure control and adjustment of the mA and/or kV according to patient size. CT count in previous 12-months:  0

Siemens nodule finding program was used to assist pulmonary nodule detection.
FINDINGS: Single calcified granuloma seen in the left lower lobe 6 mm. No soft tissue nodules. No spiculated lesions. Thoracic inlet is normal.

Large airways show nothing worrisome.

No adenopathy.

Heart size normal, no pericardial effusion. Mild aortic atherosclerosis without enlargement of thoracic aorta.

No pneumonia, pneumothorax or pleural calcification seen. No bronchiectasis.

Thoracic esophagus not dilated.

Adrenal glands not enlarged.

Osseous structures show nothing destructive or worrisome. Mild dextroscoliosis thoracic spine of 8-10 degrees.
IMPRESSION: 1.  Lung Rads Category 2    

2.  No soft tissue nodules only small calcified granuloma. Probability of malignancy less than 1%.

RECOMMENDATION:  

1. Continue yearly low-dose CT of chest in 12 months.

[URL]

## 2021-05-26 IMAGING — MR MRI LSPINE WO CONTRAST
5 series · 48 of 48 positions shown · non-contrast
Comparison: None

HISTORY: 66-year-old male with polyneuropathy.
TECHNIQUE: Multiplanar, multisequential MR images of the lumbar spine were obtained without intravenous contrast.

[Series 16: t2_sag · sagittal · 4.0mm · 0.88mm/px · 7 of 17 slices shown]
[im 1/17]
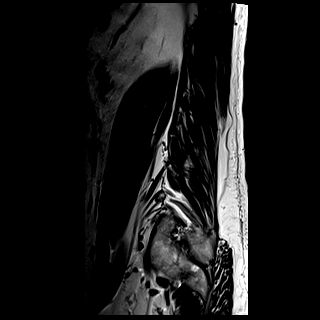
[im 3/17]
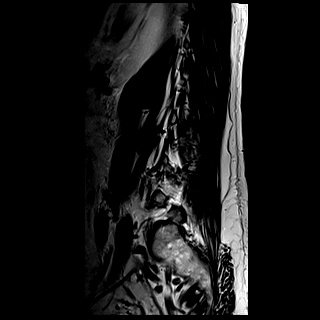
[im 6/17]
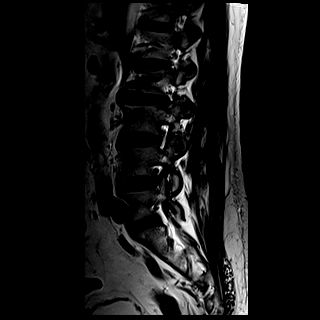
[im 9/17]
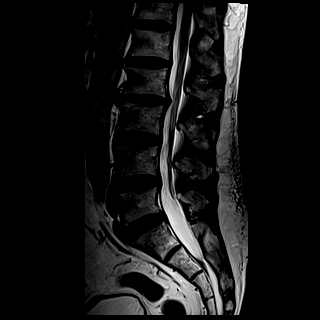
[im 11/17]
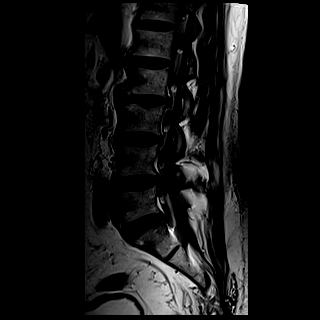
[im 14/17]
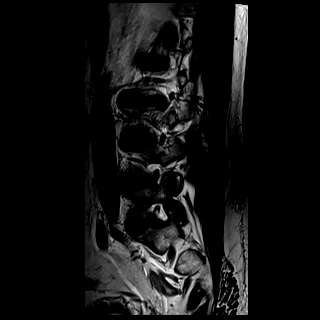
[im 17/17]
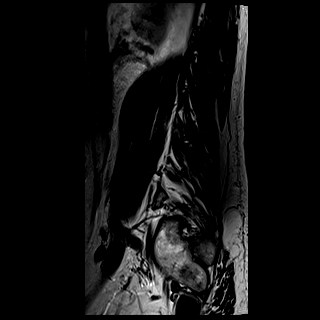

[Series 17: t1_sag · sagittal · 4.0mm · 0.88mm/px · 7 of 17 slices shown]
[im 1/17]
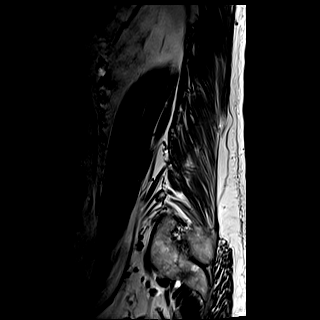
[im 3/17]
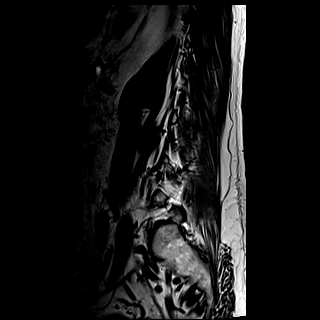
[im 6/17]
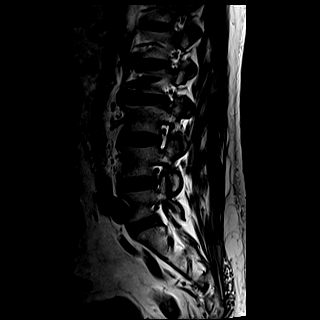
[im 9/17]
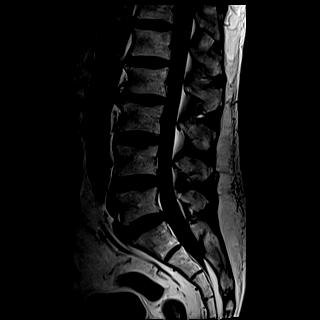
[im 11/17]
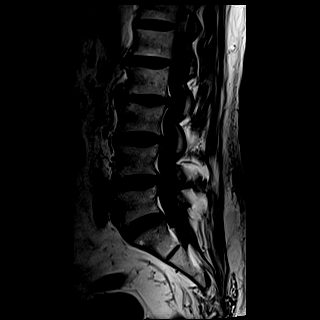
[im 14/17]
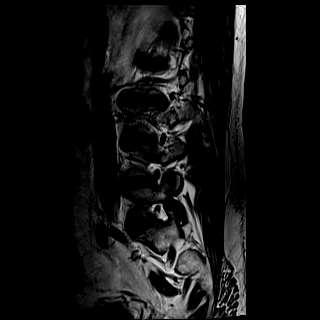
[im 17/17]
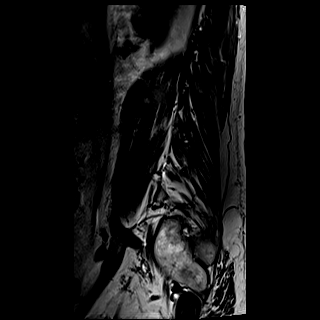

[Series 18: ir_sag · sagittal · 4.0mm · 1.09mm/px · 7 of 17 slices shown]
[im 1/17]
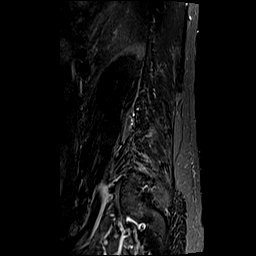
[im 3/17]
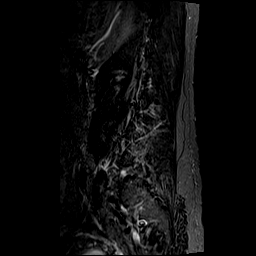
[im 6/17]
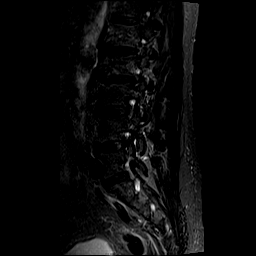
[im 9/17]
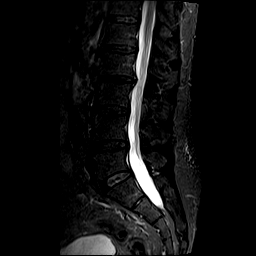
[im 11/17]
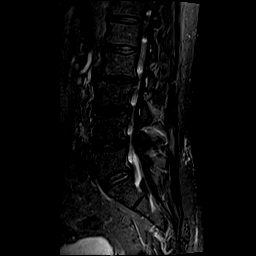
[im 14/17]
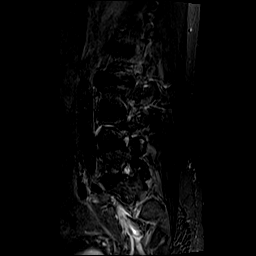
[im 17/17]
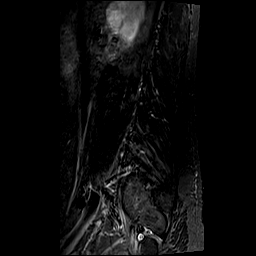

[Series 19: t2_axial · axial · 4.0mm · 0.62mm/px · z∈[-544,-367]mm · 16 of 38 slices shown]
[im 1/38]
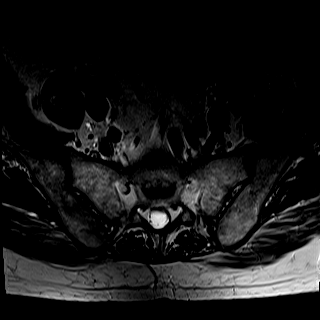
[im 3/38]
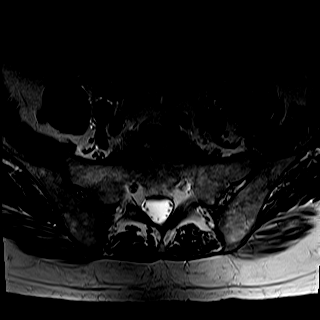
[im 5/38]
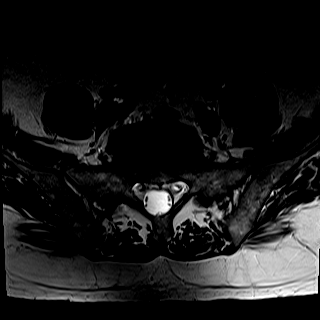
[im 8/38]
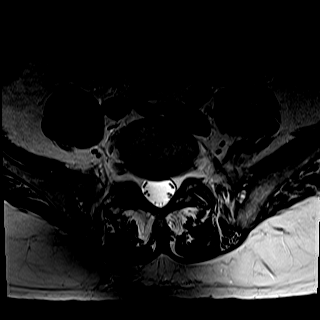
[im 10/38]
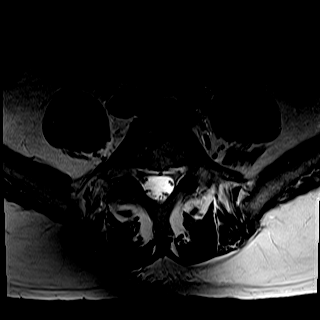
[im 13/38]
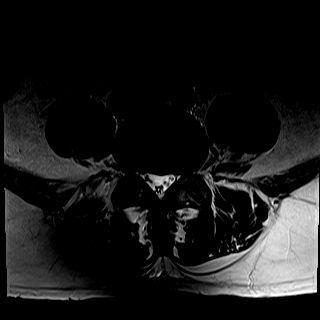
[im 15/38]
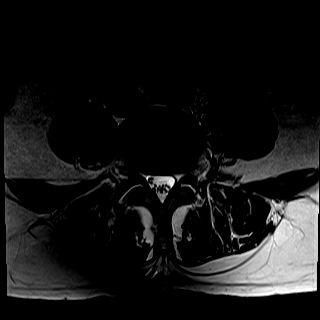
[im 18/38]
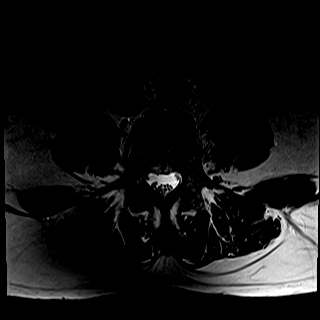
[im 20/38]
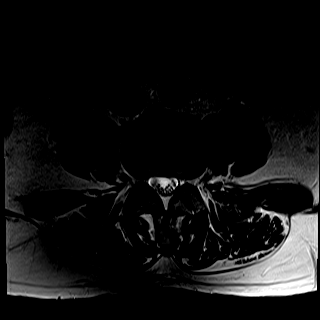
[im 23/38]
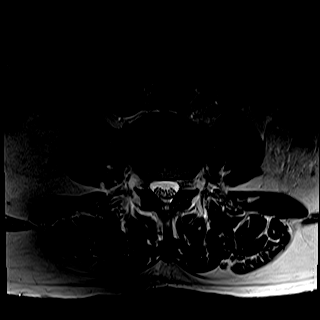
[im 25/38]
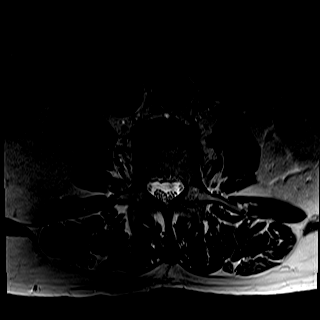
[im 28/38]
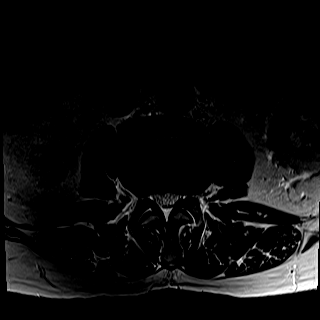
[im 30/38]
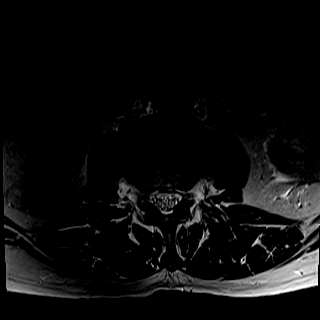
[im 33/38]
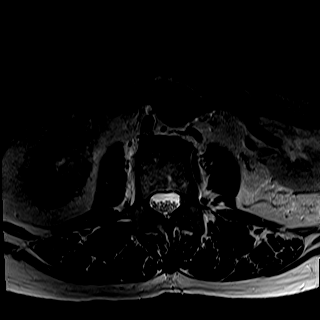
[im 35/38]
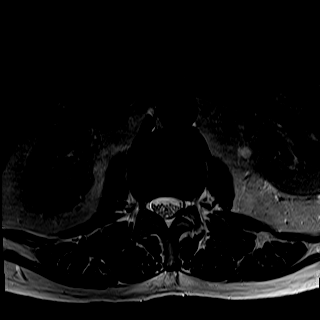
[im 38/38]
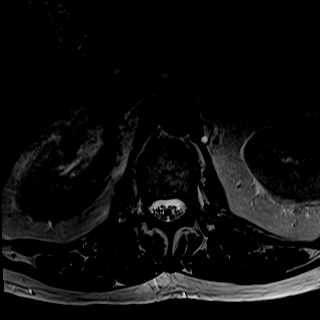

[Series 20: t1_axial_obl · axial · 3.0mm · 0.78mm/px · z∈[-561,-348]mm · 11 of 27 slices shown]
[im 1/27]
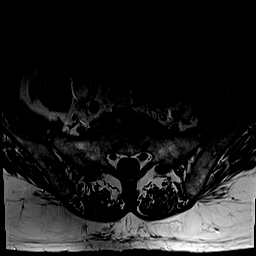
[im 3/27]
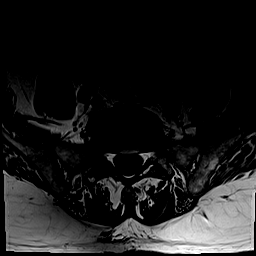
[im 6/27]
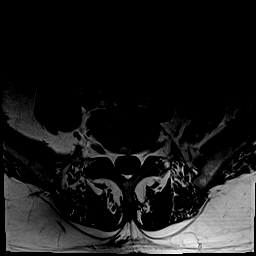
[im 8/27]
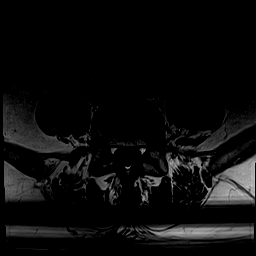
[im 11/27]
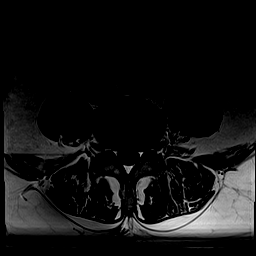
[im 14/27]
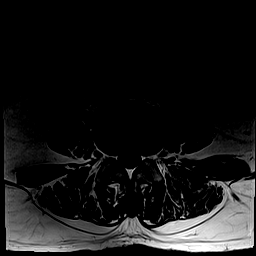
[im 16/27]
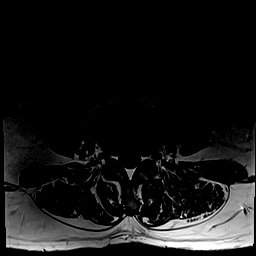
[im 19/27]
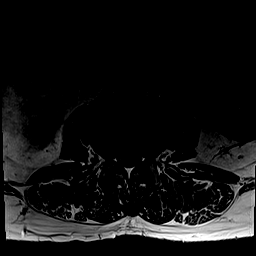
[im 21/27]
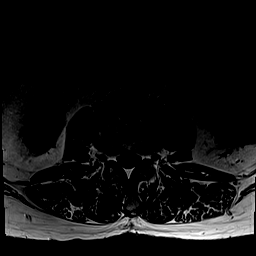
[im 24/27]
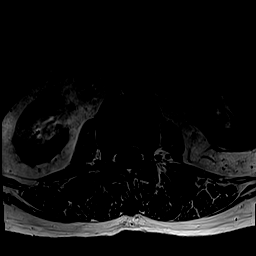
[im 27/27]
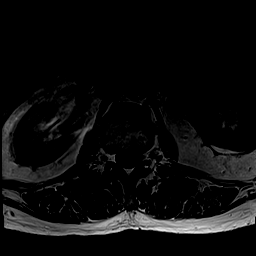

[48 of 48 positions shown; findings below may reference images not displayed]

FINDINGS: Spine labeling: Based on counting caudally using the whole spine localizer.

Alignment: Straightening of the lumbar lordosis. No subluxation.  

Vertebrae and discs: Multilevel degenerative disc disease and mild discogenic endplate change. 

Marrow: No suspicious osseous lesion or acute fracture.

Conus: Ends at a normal level. Unremarkable cauda equina nerve roots.

T12-L1: No significant canal or neural foraminal stenosis.

L1-L2: No significant canal or neural foraminal stenosis.

L2-L3: Moderate disc bulge and endplate osteophytes. Mild facet arthrosis. Mild to moderate canal and mild bilateral neural foraminal stenosis.

L3-L4: Mild disc bulge. Mild facet arthrosis. No significant canal or neural foraminal stenosis.

L4-L5: Mild disc bulge. Mild facet arthrosis. No significant canal stenosis. Mild left neural foraminal stenosis.

L5-S1: Minimal disc bulge. Moderate facet arthrosis. No significant canal or neural foraminal stenosis.

Mild degenerative changes of the sacroiliac joints.
IMPRESSION: Mild to moderate canal stenosis at L2-L3 secondary to moderate disc bulge and endplate osteophytes.

No other high-grade canal or neural foraminal narrowing.

## 2021-06-14 IMAGING — CR L-SPINE W FLEX MIN 6 VWS
1 series · 7 of 7 positions shown · non-contrast
Comparison: None.

HISTORY: 66-year-old male with lumbar radiculopathy.
TECHNIQUE: 7 view radiograph of the lumbar spine. Flexion and extension images were obtained

[Series 1: AP · 0.17mm/px · 7 of 7 slices shown]
[im 1/7]
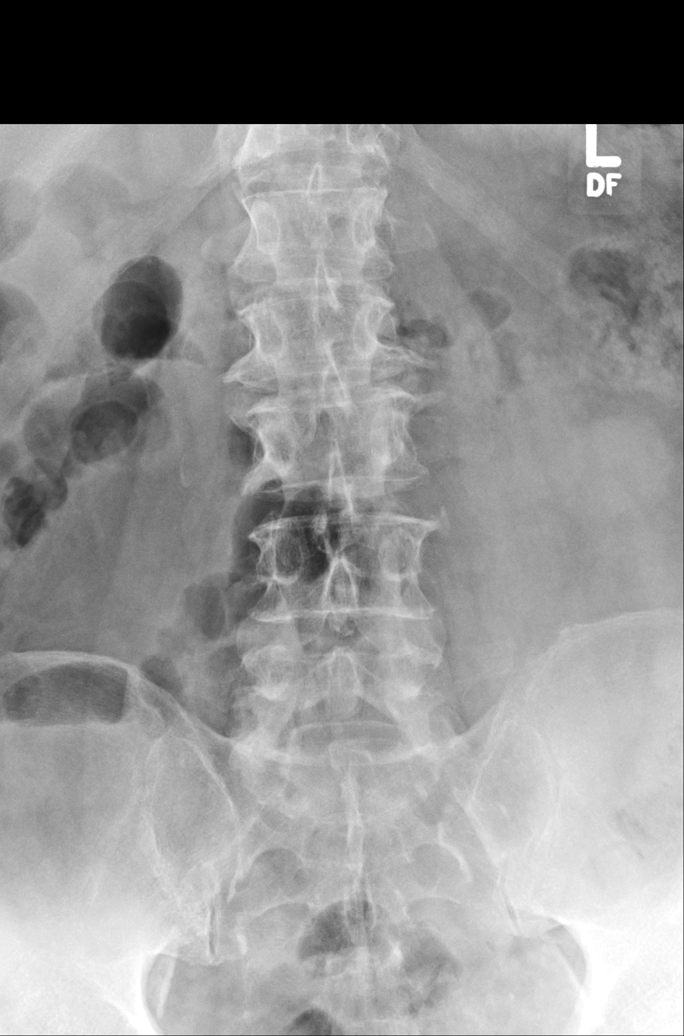
[im 2/7]
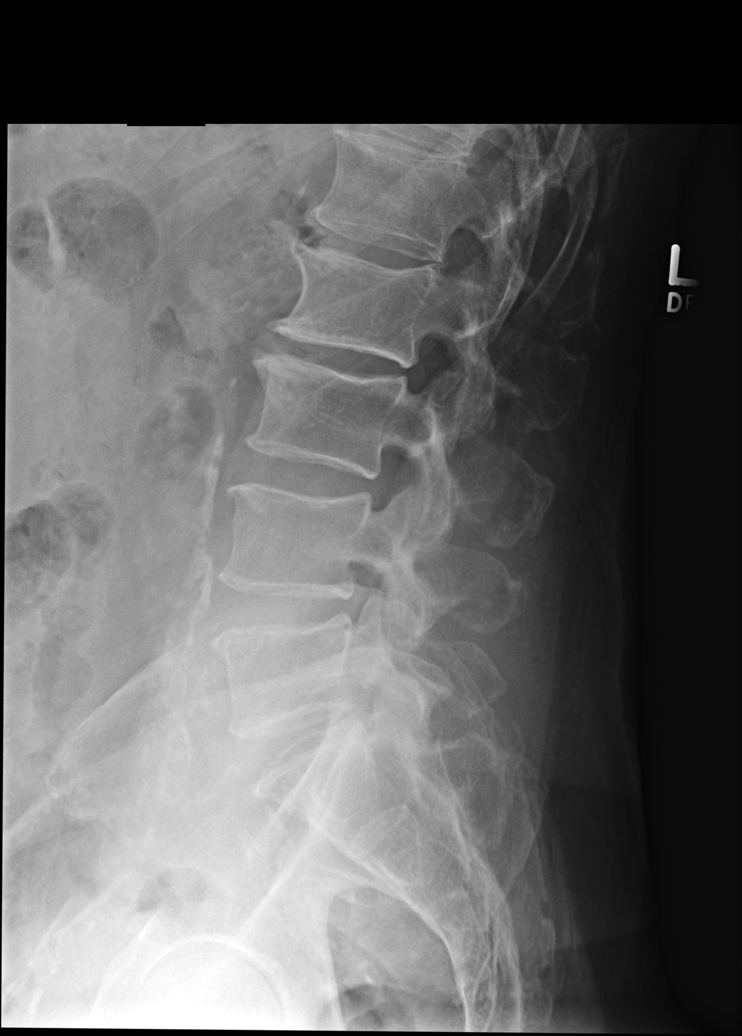
[im 3/7]
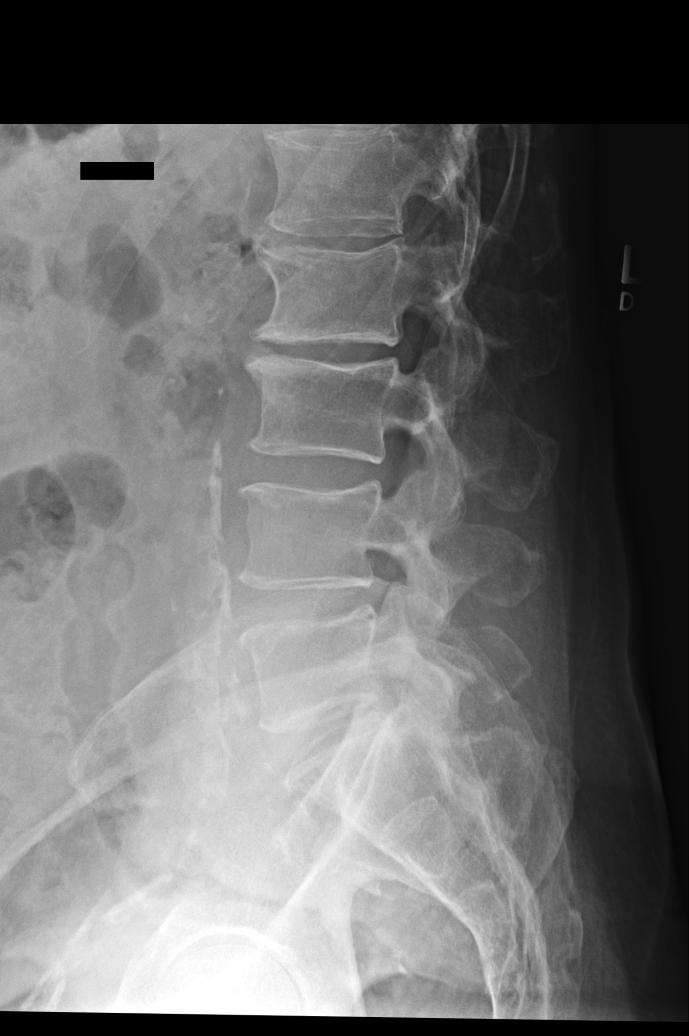
[im 4/7]
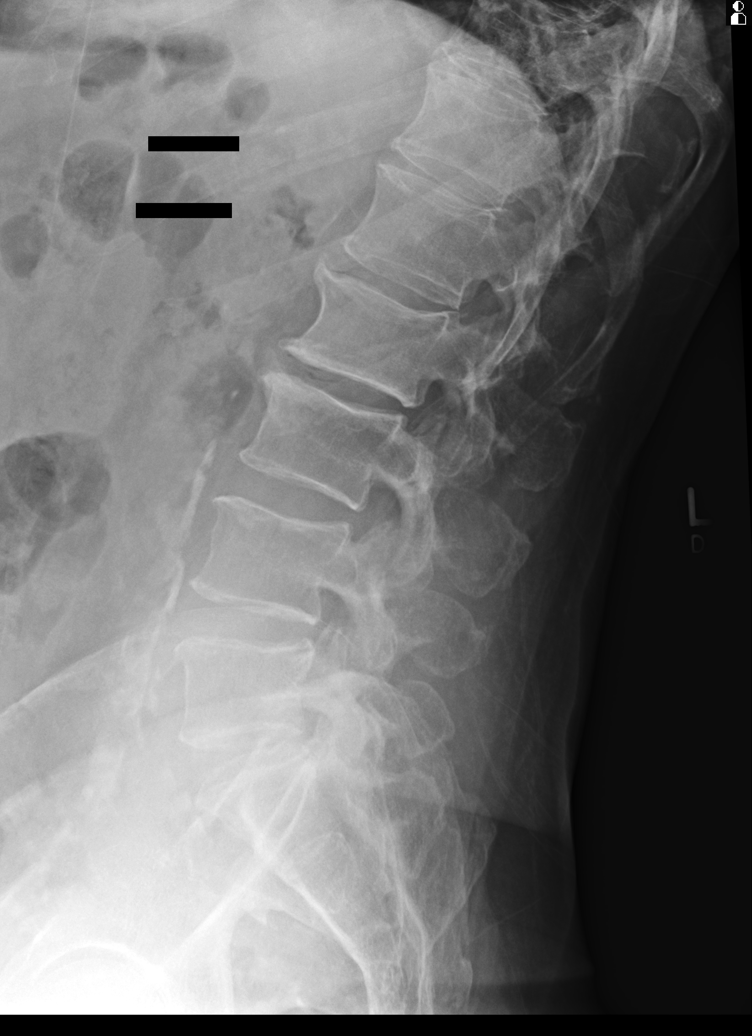
[im 5/7]
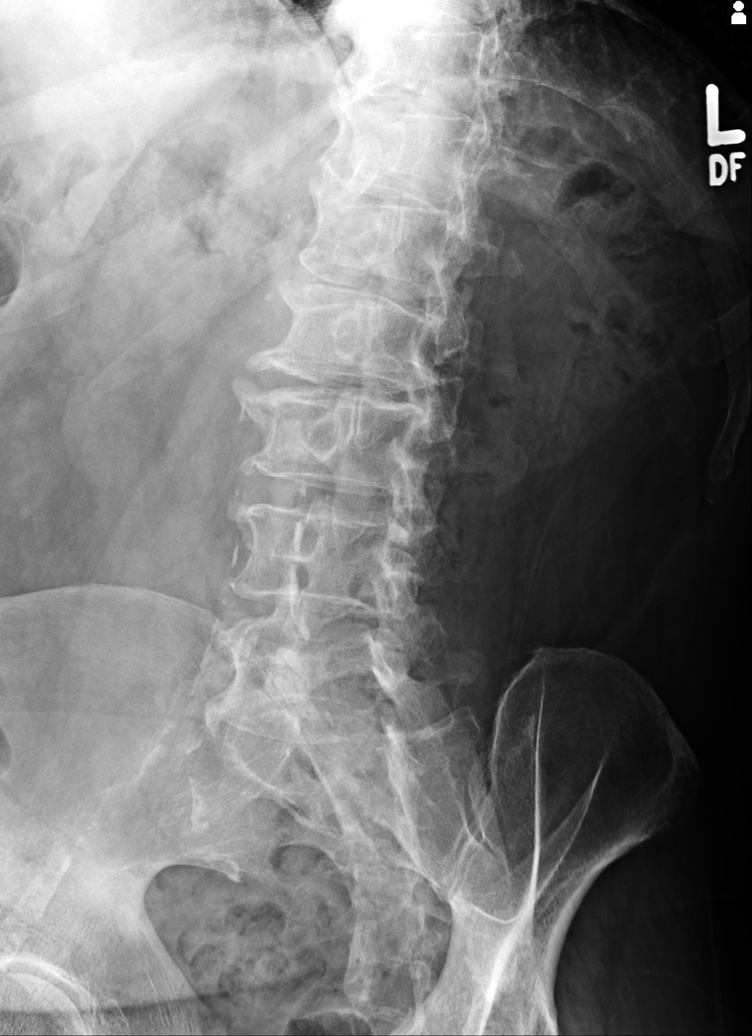
[im 6/7]
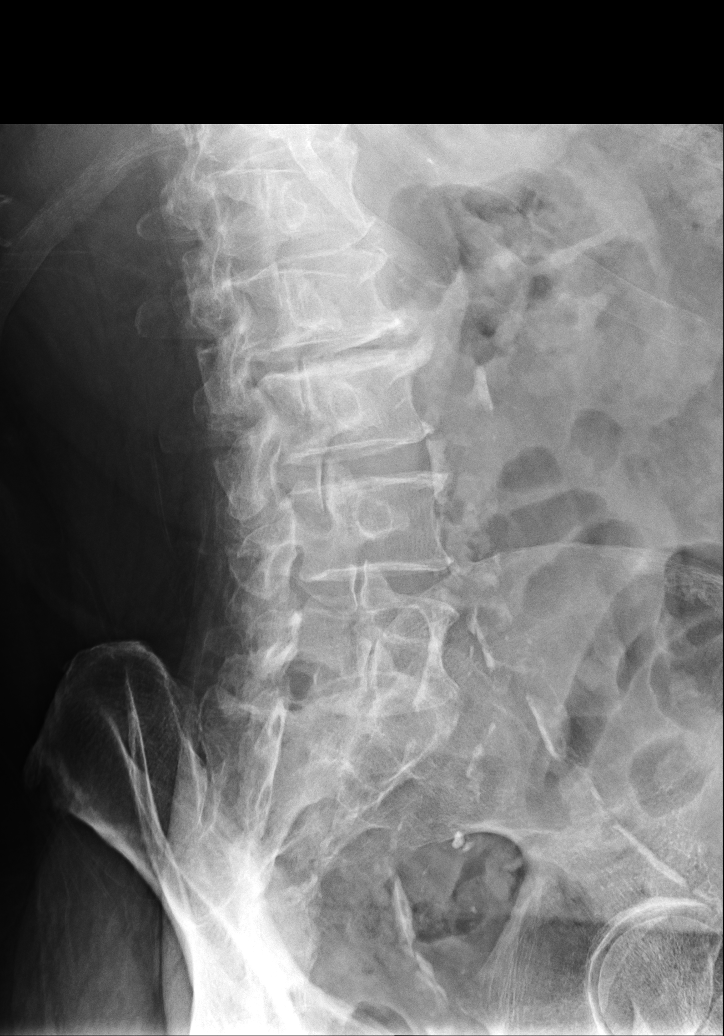
[im 7/7]
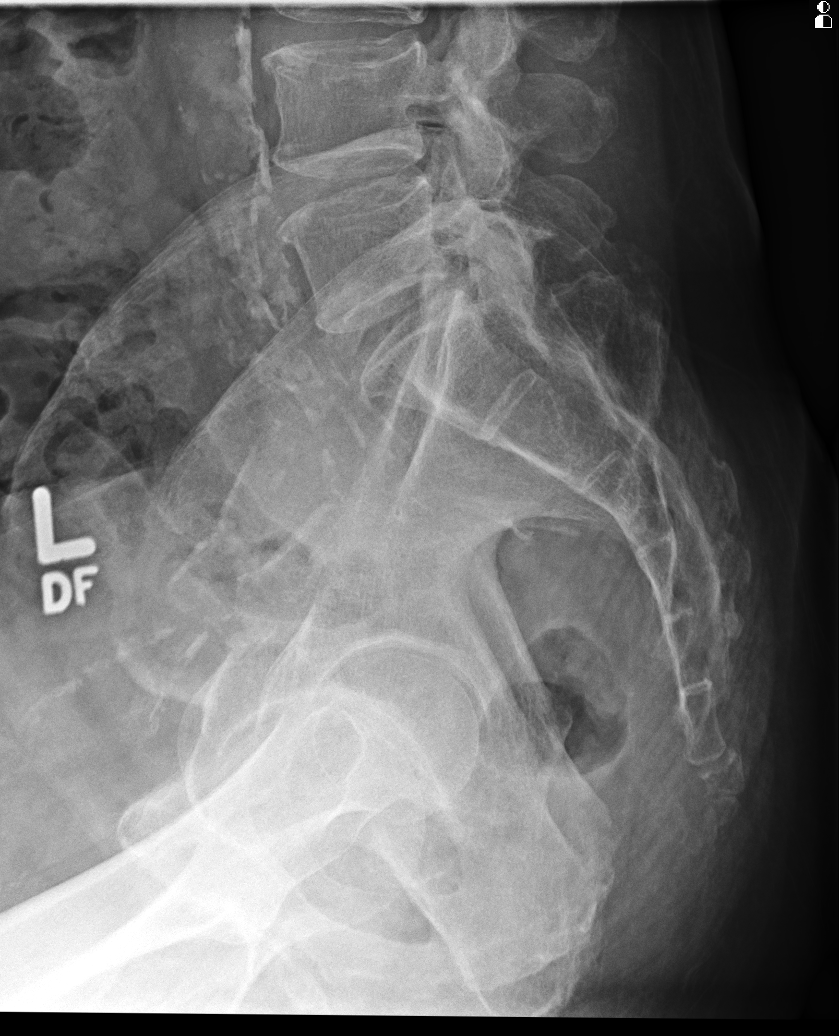

[7 of 7 positions shown; findings below may reference images not displayed]

FINDINGS: Please note that spinal labeling was performed assuming there are 5 non-rib-bearing vertebrae.

There is normal alignment of the vertebral bodies and facet joints. No translation on dynamic images. Vertebral body heights are maintained.  Multilevel degenerative disc disease, osteophytosis and facet arthropathy.

Mild to moderate bilateral SI joint osteoarthritis.

Scattered vascular calcifications overlie the abdomen.
IMPRESSION: 1.
Multilevel spondylotic changes.

2.
Mild to moderate bilateral SI joint osteoarthritis.

## 2021-06-16 IMAGING — US DOP ARTERIAL LWR EXT BIL
1 series · 13 of 16 positions shown · non-contrast
Comparison: May 29, 2019

REASON FOR EXAM: Peripheral polyneuropathy

[Series 1: dop arterial lwr ext bil · arterial · 13 of 36 slices shown]
[im 1/36]
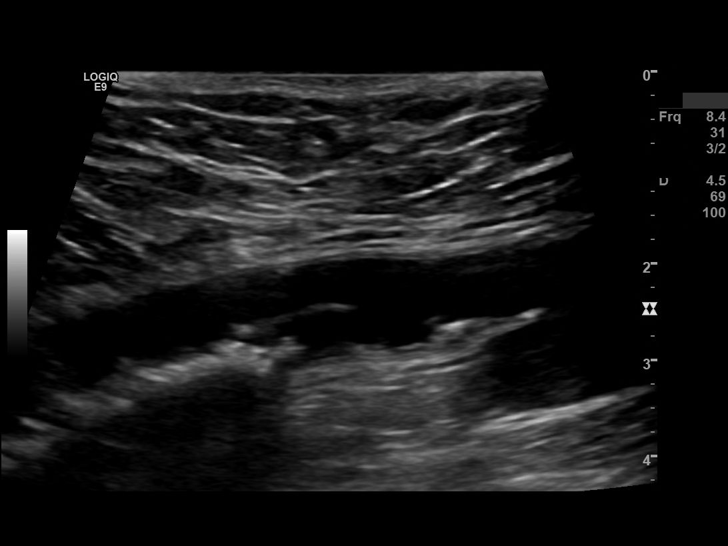
[im 3/36]
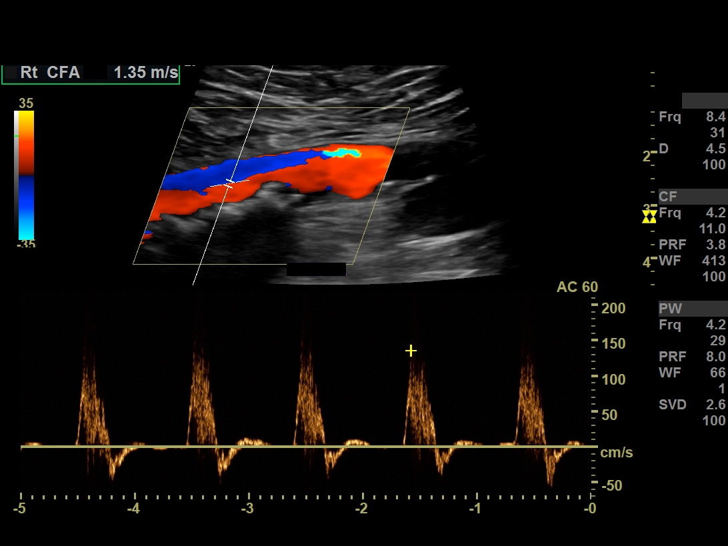
[im 8/36]
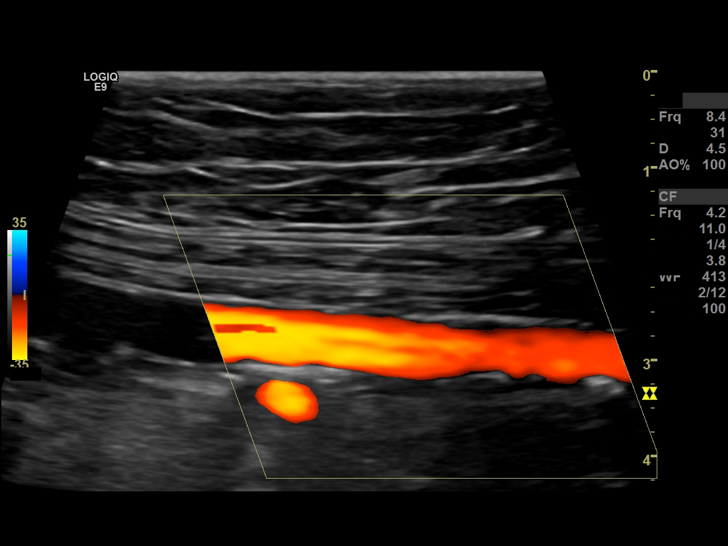
[im 10/36]
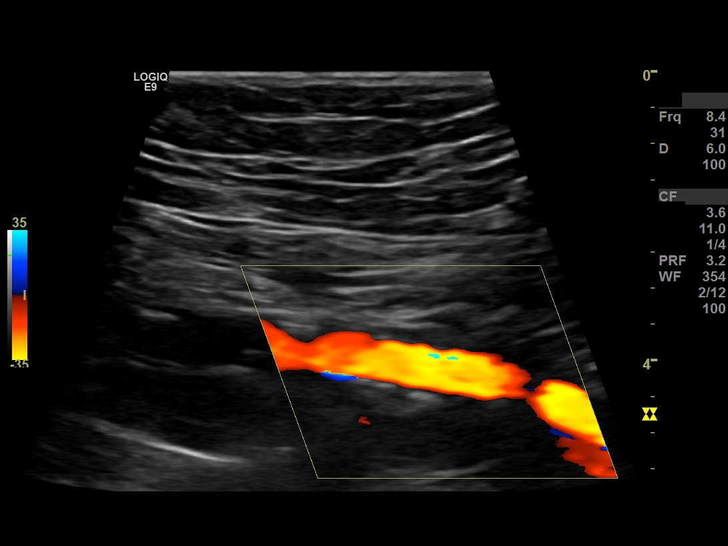
[im 12/36]
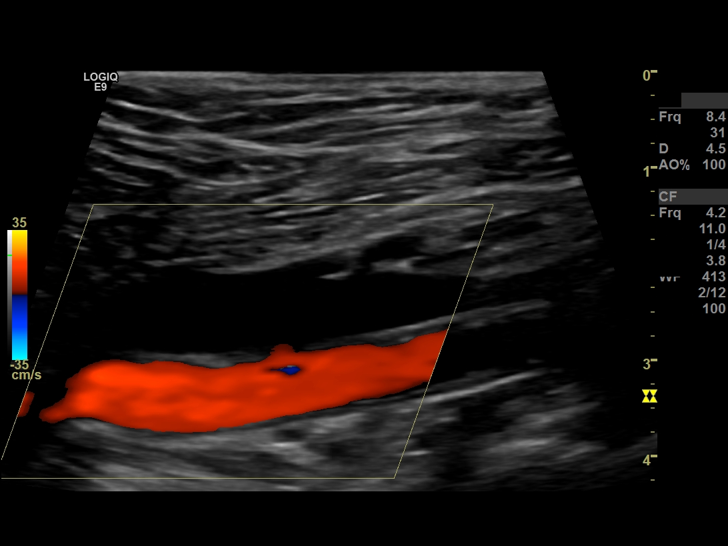
[im 15/36]
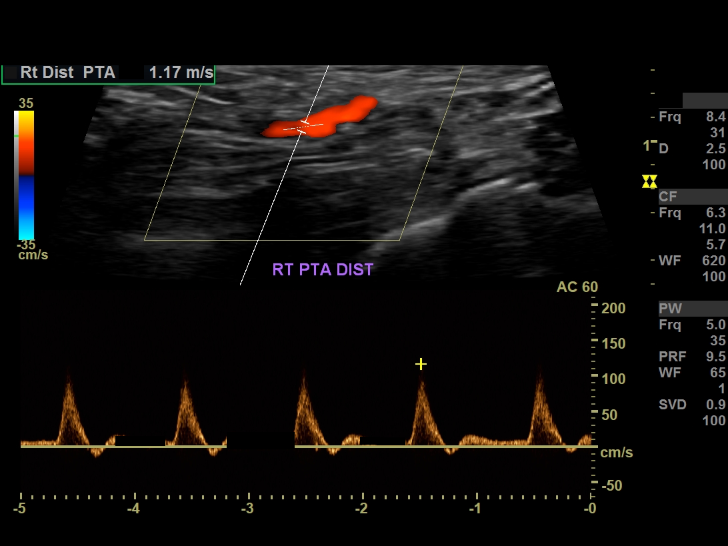
[im 19/36]
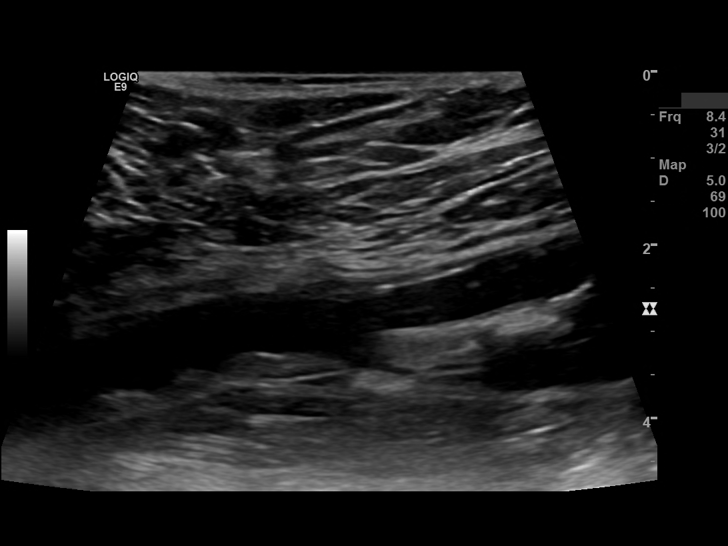
[im 22/36]
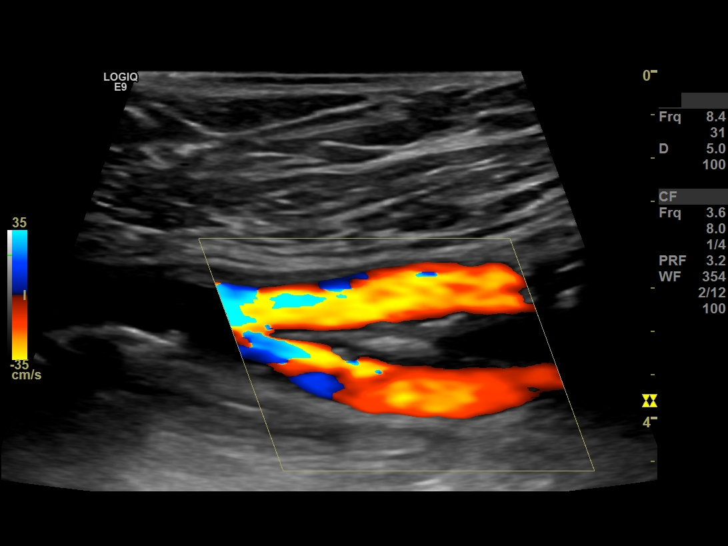
[im 24/36]
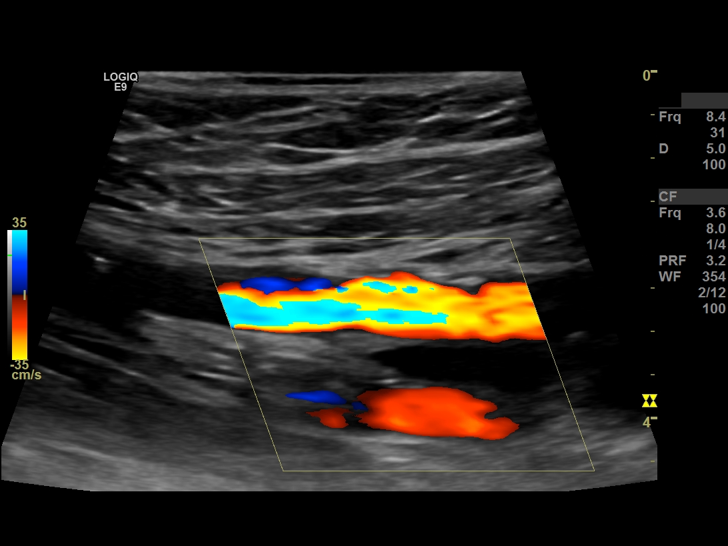
[im 26/36]
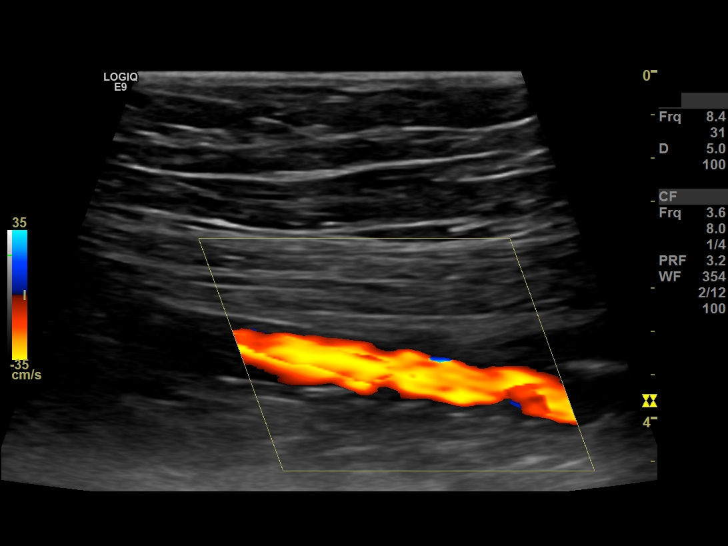
[im 29/36]
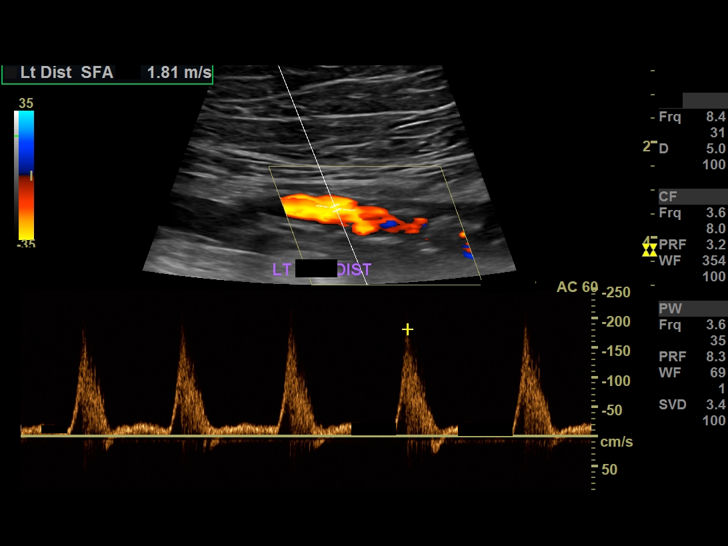
[im 33/36]
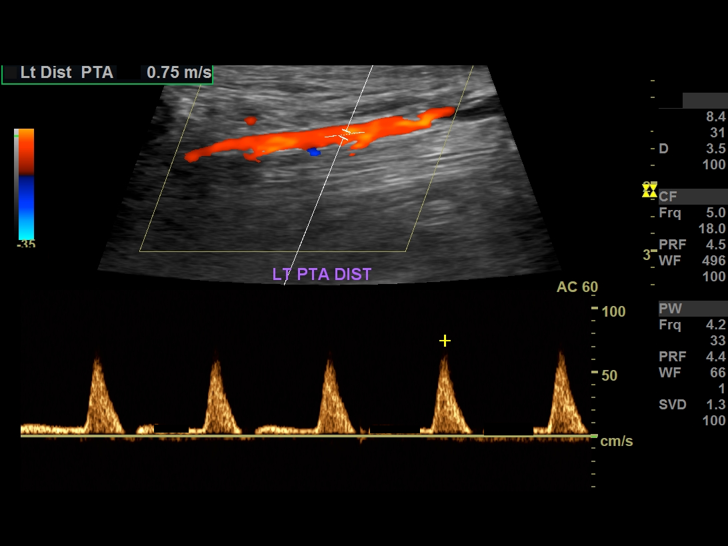
[im 36/36]
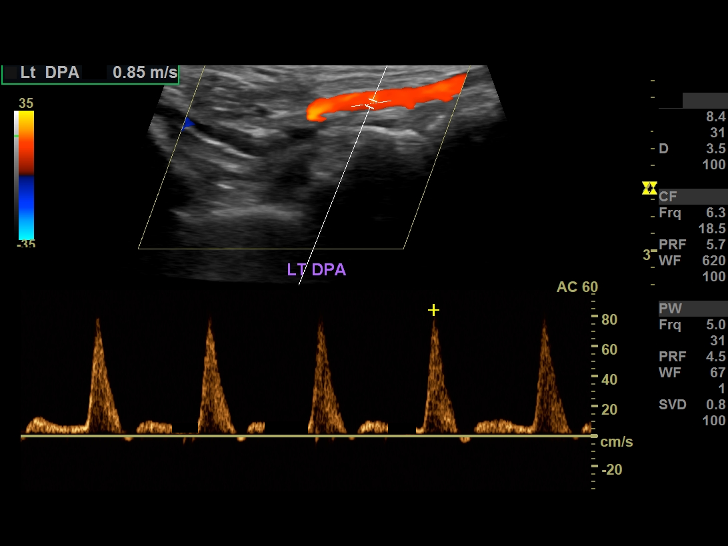

[13 of 16 positions shown; findings below may reference images not displayed]

TECHNIQUE AND FINDINGS:

Multiple ultrasound images of the bilateral lower extremities arteries were obtained using gray-scale, color flow and spectral waveform analysis. All measurements are in centimeters per second. 

The right lower extremity shows scattered atherosclerotic plaquing throughout the visualized femoral arteries. There are triphasic waveforms throughout all the visualized arteries.

Flow velocities are:

135 in the common femoral

103-142 in the superficial femoral 

75-79 in the popliteal

67 in the anterior tibial

117 in the posterior tibial 

89 in the peroneal 

32 in the dorsalis pedis.

The left lower extremity shows triphasic waveforms throughout all the visualized arteries except the peroneal artery which is biphasic. Patchy atherosclerotic vascular calcification is seen across the upper femorals.

Flow velocities are: 

173 in the common femoral

148-181 in the superficial femoral

71-89 in the popliteal

92 in the anterior tibial

75 in the posterior tibial 

52 in the peroneal

85 in the dorsalis pedis.
IMPRESSION: Atherosclerotic plaquing without significant vascular occlusive disease.

## 2021-07-25 IMAGING — CR ABDOMEN  KUB ONLY
1 series · 2 of 2 positions shown · non-contrast
Comparison: None

Images Obtained from [facility]
KUB, 2 images
INDICATION: Vomiting

[Series 1: AP · 0.17mm/px · 2 of 2 slices shown]
[im 1/2]
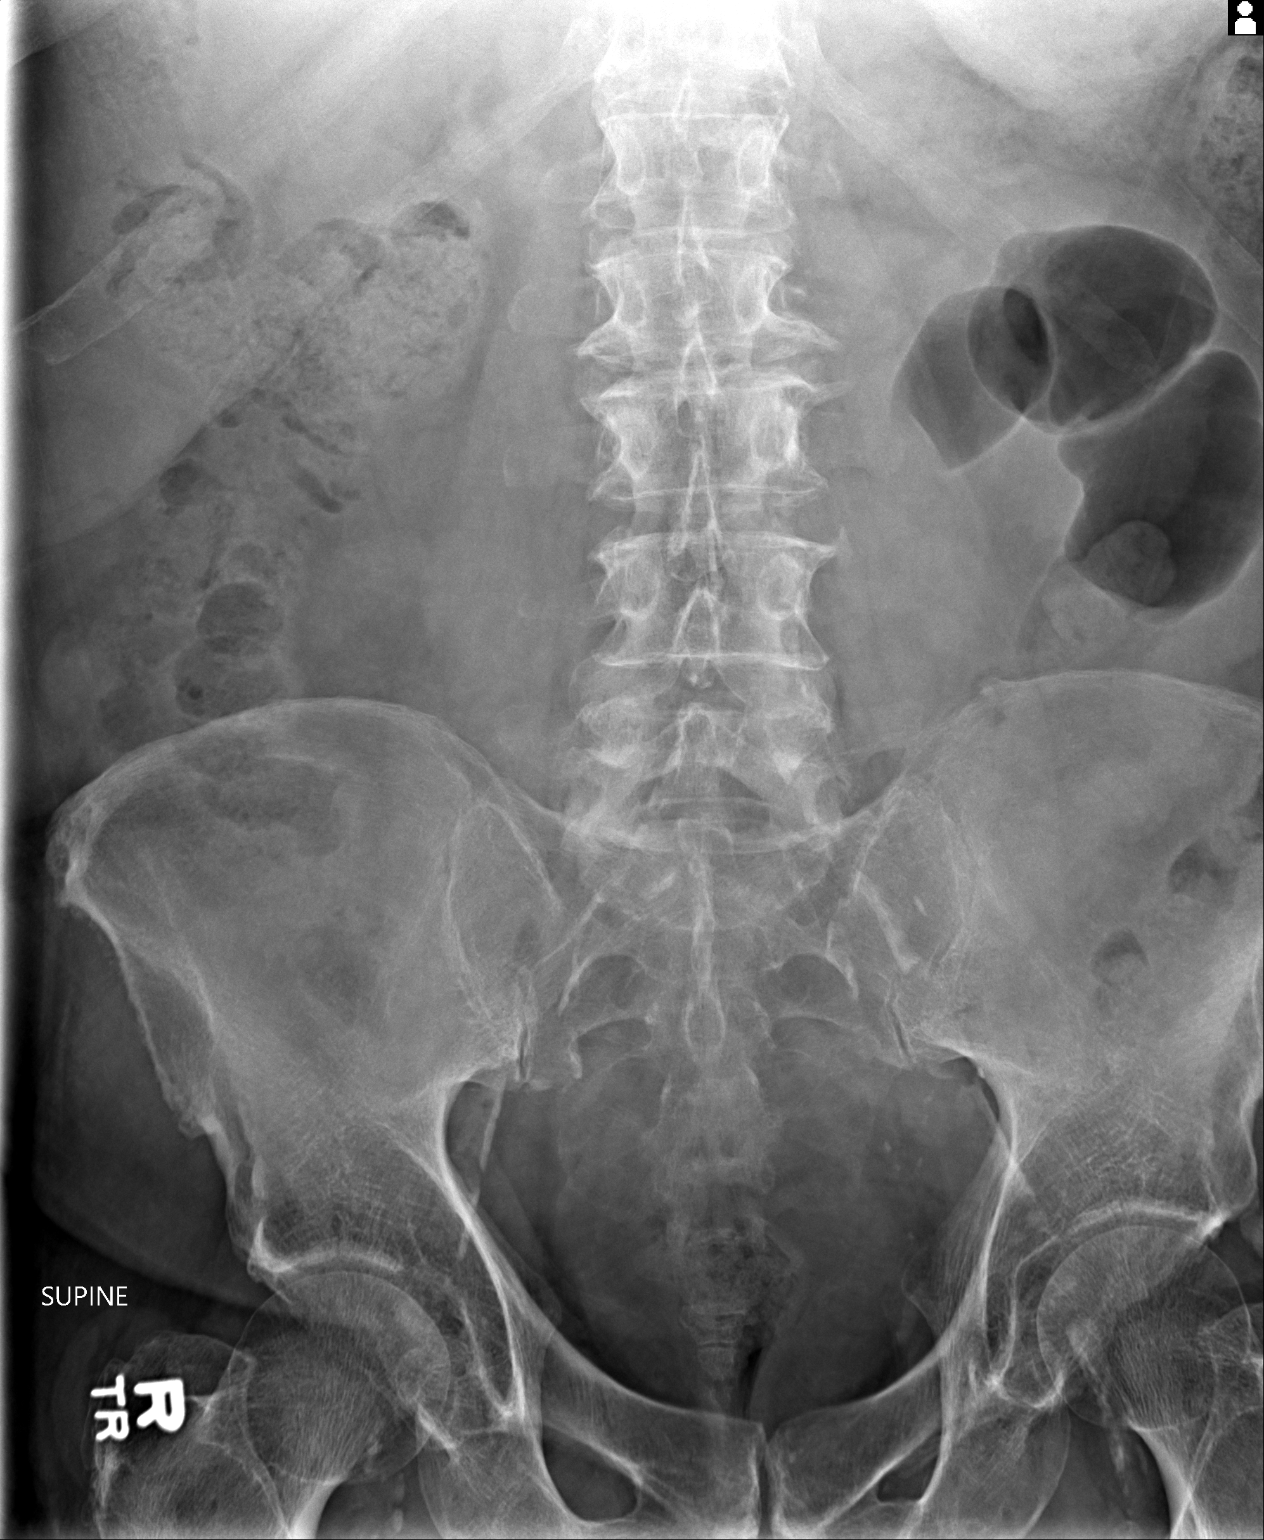
[im 2/2]
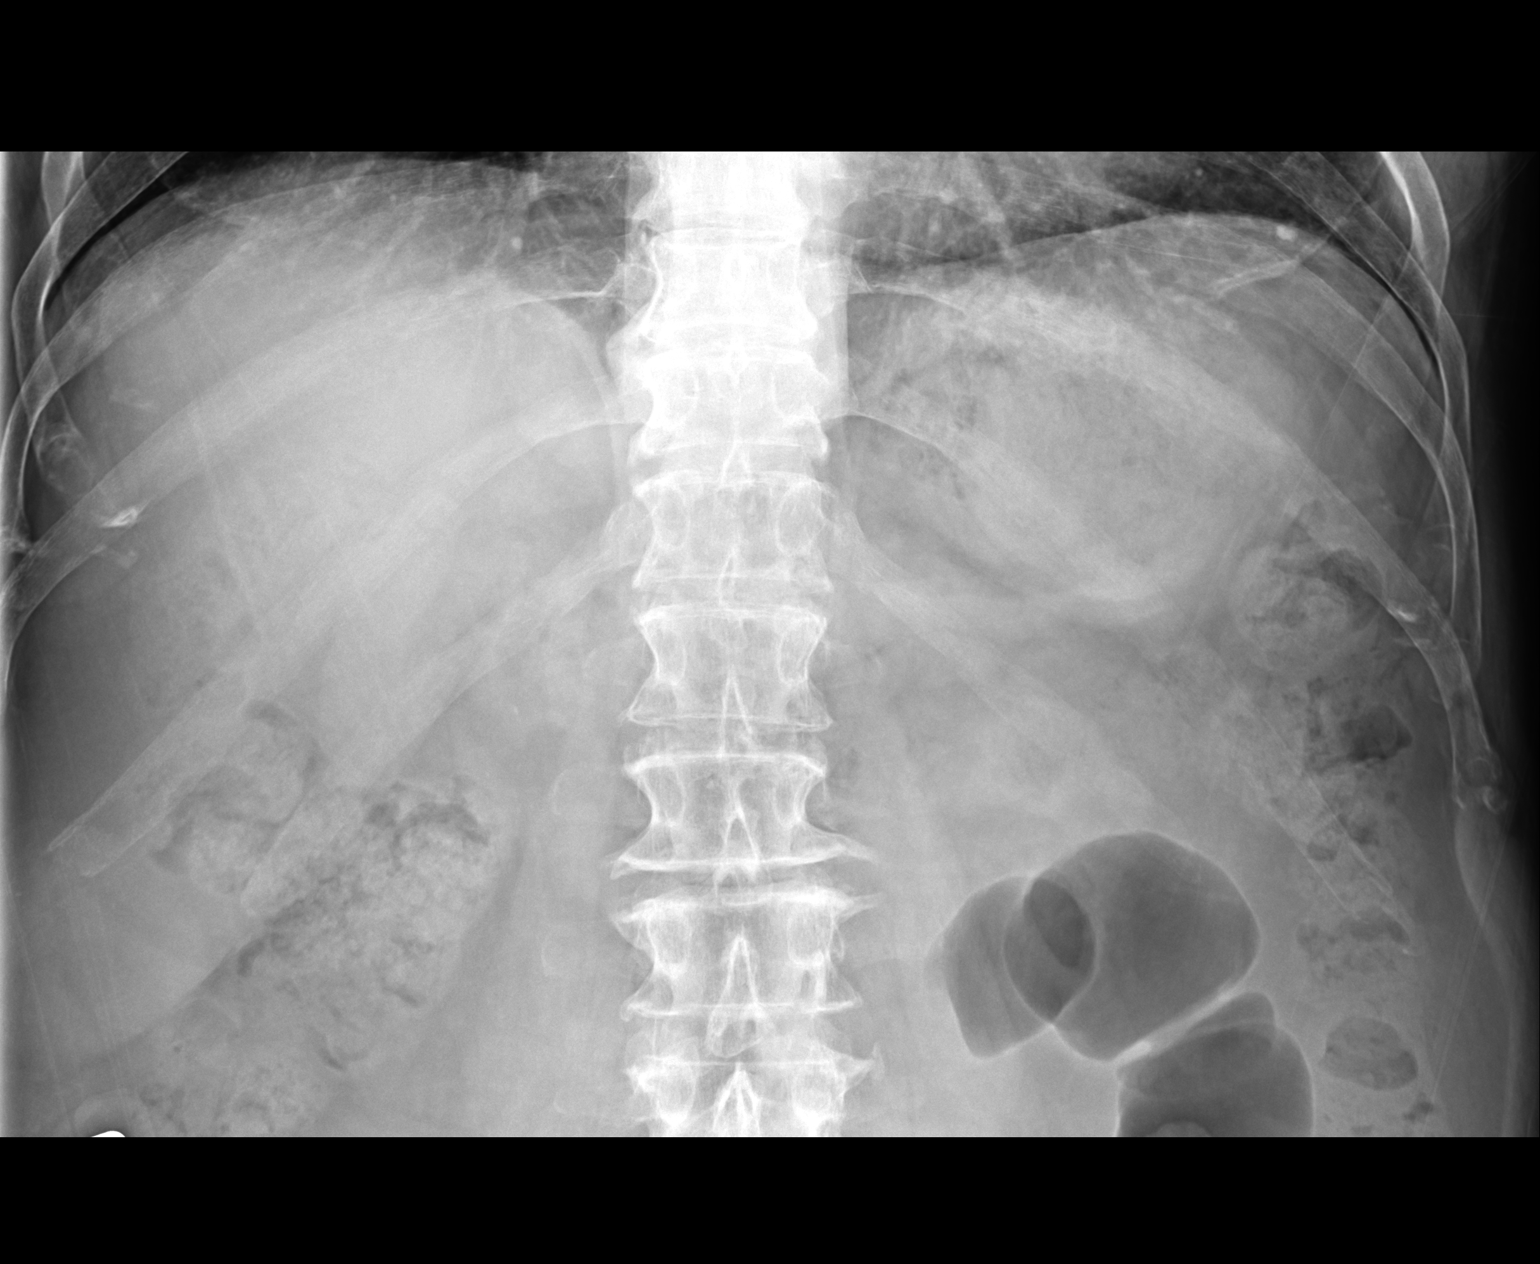

[2 of 2 positions shown; findings below may reference images not displayed]

FINDINGS: Nonobstructive gas pattern. Mild to moderate stool throughout the colon.
Calcified aortoiliac arterial plaques. Normal mineralization.
IMPRESSION: No acute disease on plain film.

## 2021-08-12 IMAGING — US US AORTA SCREENING (AAA)
1 series · 11 of 11 positions shown · non-contrast
Comparison: None.

Images Obtained from [HOSPITAL] Imaging
REASON FOR EXAM: Encounter for screening for cardiovascular disorders, personal history of nicotine dependence
TECHNIQUE: Real-time duplex sonography of the abdominal aorta and iliac arteries with color Doppler.

[Series 1: us aorta screening (aaa) · 11 of 11 slices shown]
[im 1/11]
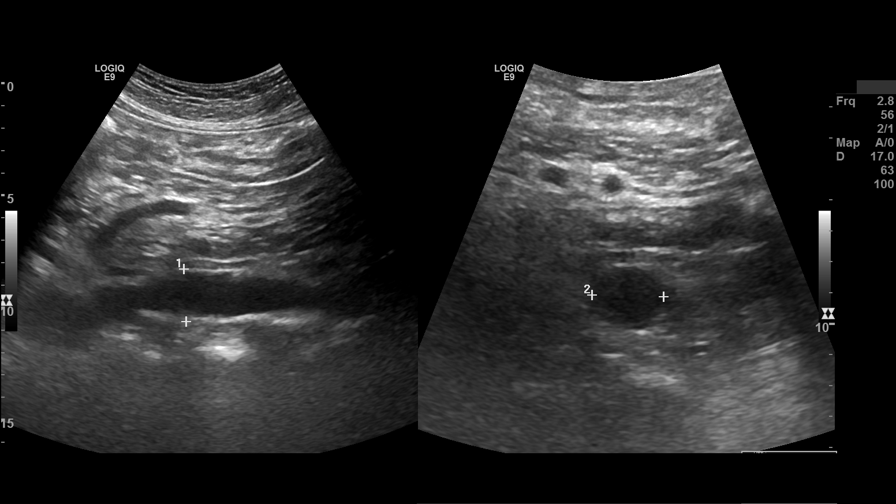
[im 2/11]
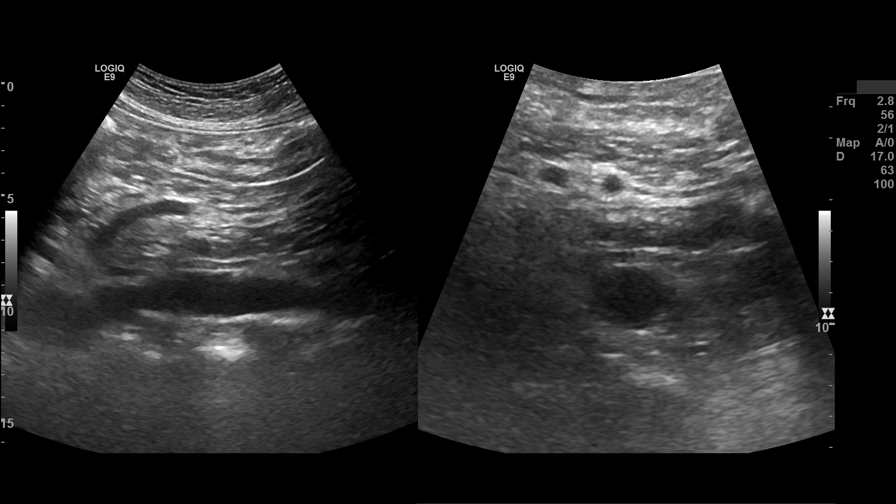
[im 3/11]
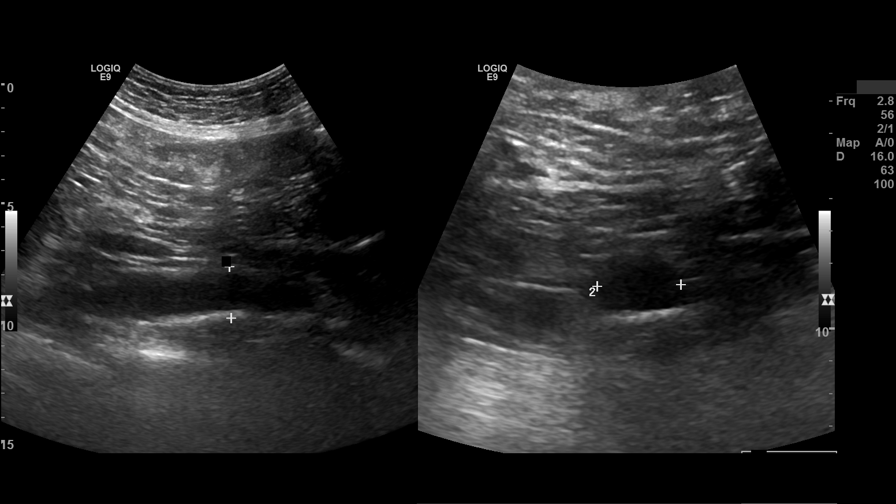
[im 4/11]
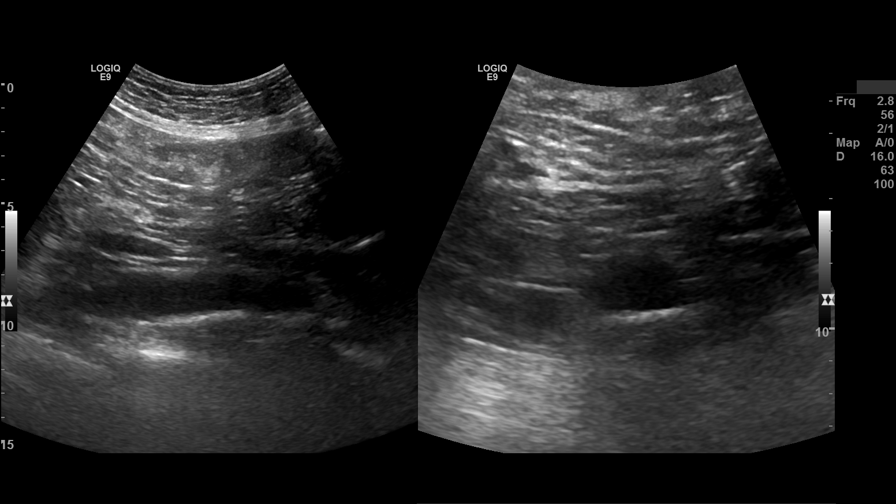
[im 5/11]
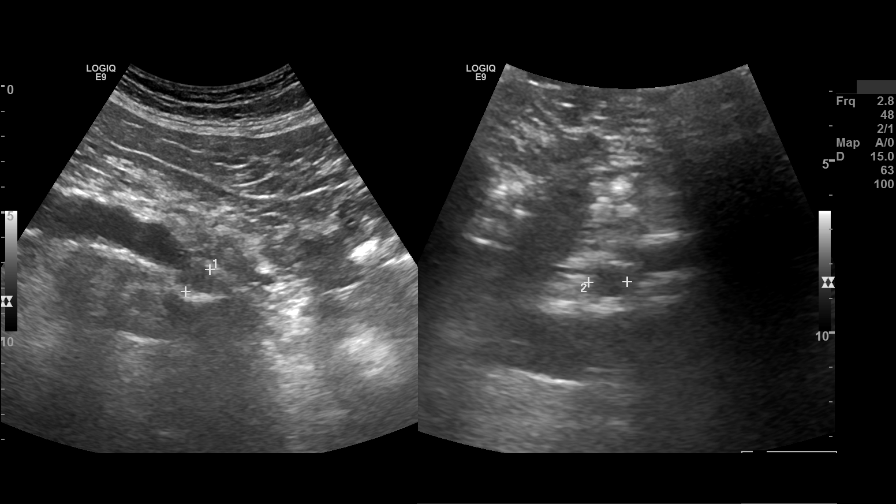
[im 6/11]
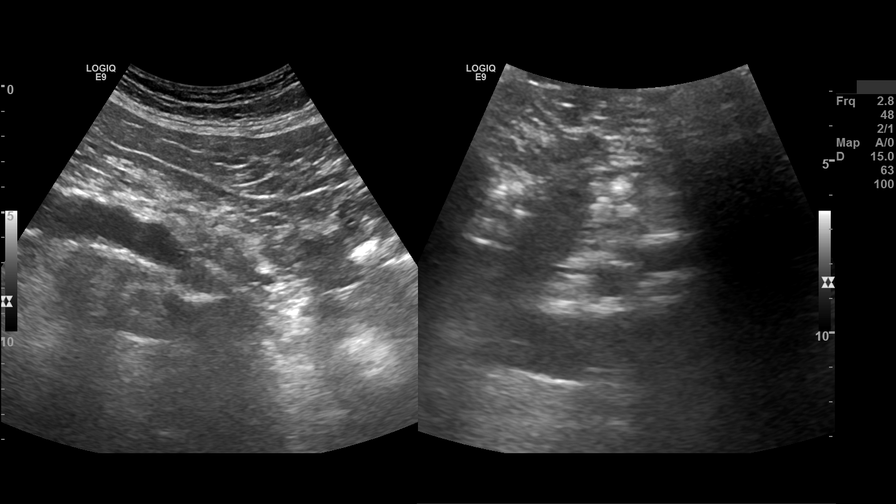
[im 7/11]
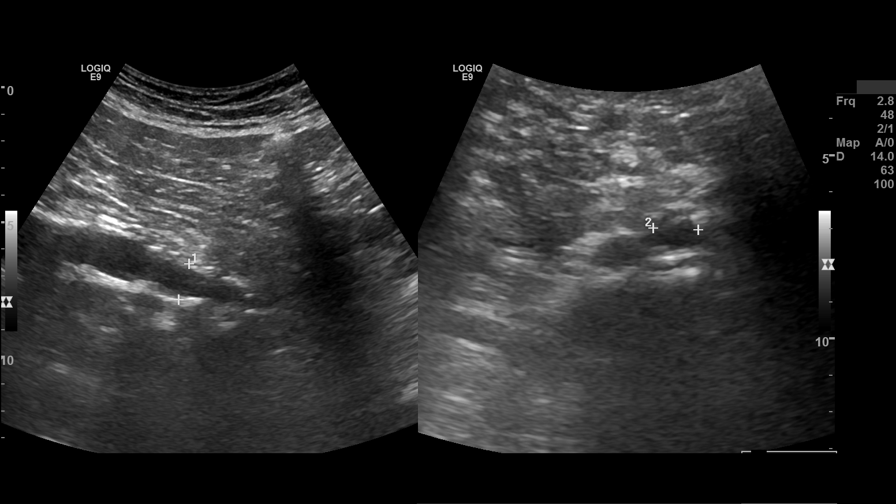
[im 8/11]
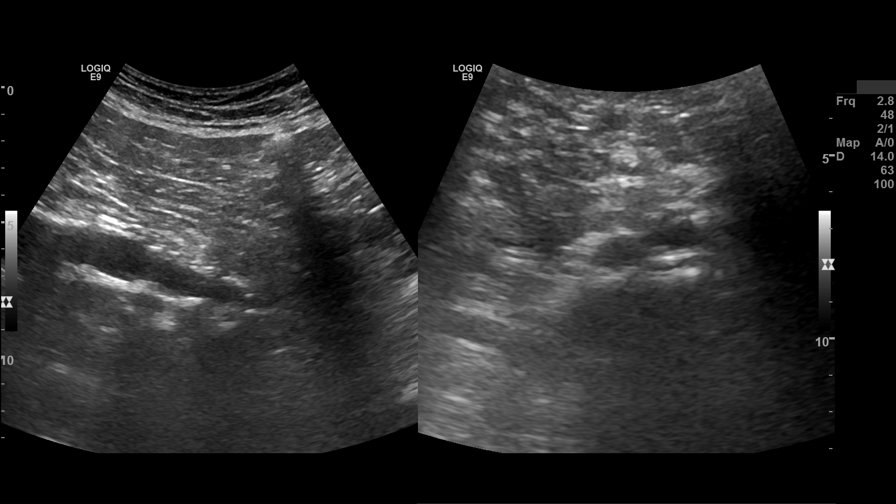
[im 9/11]
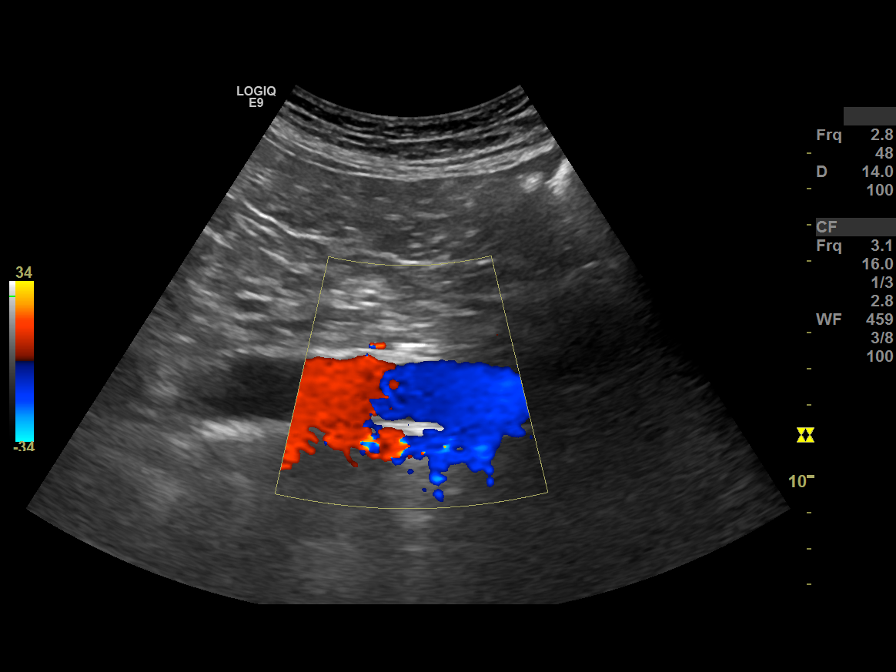
[im 10/11]
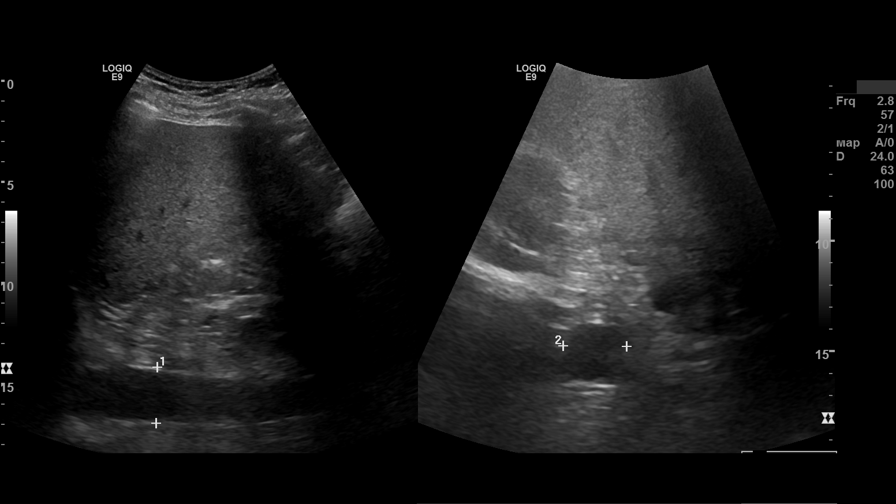
[im 11/11]
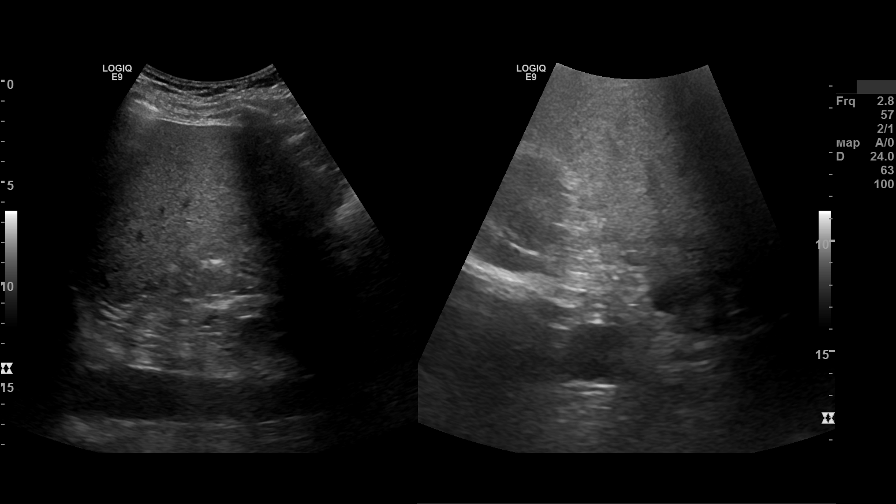

[11 of 11 positions shown; findings below may reference images not displayed]

FINDINGS: Image quality is Satisfactory.
AORTA MEASUREMENTS
AORTA PROX:
A/P:  28 mm
Transverse:  29 mm
AORTA MID:
A/P:  23 mm
Transverse:  23 mm
AORTA DISTAL:
A/P:  22 mm
Transverse:  26 mm
RIGHT COMMON ILIAC ARTERY
A/P:  13 mm
Transverse:  11mm
LEFT COMMON ILIAC ARTERY
A/P:  14 mm
Transverse:  12 mm
ANEURYSM MEASUREMENTS
None
PLAQUE:
IMPRESSION: No evidence of aortic aneurysm.
